# Patient Record
Sex: Male | Born: 1995 | Race: White | Hispanic: No | Marital: Single | Smoking: Current every day smoker
Health system: Southern US, Community
[De-identification: ages and names within clinical notes are randomized; demographics above are authoritative.]

## PROBLEM LIST (undated history)

## (undated) HISTORY — PX: EYE SURGERY: SHX253

---
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
## 2016-05-17 ENCOUNTER — Observation Stay
Admission: EM | Admit: 2016-05-17 | Discharge: 2016-05-18 | Disposition: A | Discharge status: Home / Self Care | Payer: Self-pay | Attending: Urology | Admitting: Urology

## 2016-05-17 ENCOUNTER — Encounter
Admission: EM | Disposition: A | Discharge status: Home / Self Care | Payer: Self-pay | Source: Home / Self Care | Attending: Emergency Medicine

## 2016-05-17 ENCOUNTER — Emergency Department: Payer: Self-pay

## 2016-05-17 ENCOUNTER — Encounter: Payer: Self-pay | Admitting: Emergency Medicine

## 2016-05-17 DIAGNOSIS — F17200 Nicotine dependence, unspecified, uncomplicated: Secondary | ICD-10-CM | POA: Insufficient documentation

## 2016-05-17 DIAGNOSIS — Y998 Other external cause status: Secondary | ICD-10-CM | POA: Insufficient documentation

## 2016-05-17 DIAGNOSIS — W010XXA Fall on same level from slipping, tripping and stumbling without subsequent striking against object, initial encounter: Secondary | ICD-10-CM | POA: Insufficient documentation

## 2016-05-17 DIAGNOSIS — S3022XA Contusion of scrotum and testes, initial encounter: Secondary | ICD-10-CM

## 2016-05-17 DIAGNOSIS — Y92828 Other wilderness area as the place of occurrence of the external cause: Secondary | ICD-10-CM | POA: Insufficient documentation

## 2016-05-17 DIAGNOSIS — T1490XA Injury, unspecified, initial encounter: Secondary | ICD-10-CM

## 2016-05-17 HISTORY — PX: CYSTOSCOPY: SHX5120

## 2016-05-17 HISTORY — PX: HEMATOMA EVACUATION: SHX5118

## 2016-05-17 HISTORY — PX: SCROTAL EXPLORATION: SHX2386

## 2016-05-17 LAB — CBC
HCT: 44.6 % (ref 40.0–52.0)
Hemoglobin: 15.4 g/dL (ref 13.0–18.0)
MCH: 31.1 pg (ref 26.0–34.0)
MCHC: 34.6 g/dL (ref 32.0–36.0)
MCV: 89.8 fL (ref 80.0–100.0)
Platelets: 330 10*3/uL (ref 150–440)
RBC: 4.97 MIL/uL (ref 4.40–5.90)
RDW: 13.1 % (ref 11.5–14.5)
WBC: 17.1 10*3/uL — ABNORMAL HIGH (ref 3.8–10.6)

## 2016-05-17 LAB — BASIC METABOLIC PANEL
Anion gap: 11 (ref 5–15)
BUN: 13 mg/dL (ref 6–20)
CHLORIDE: 106 mmol/L (ref 101–111)
CO2: 22 mmol/L (ref 22–32)
Calcium: 9.5 mg/dL (ref 8.9–10.3)
Creatinine, Ser: 0.95 mg/dL (ref 0.61–1.24)
GFR calc Af Amer: >60 mL/min (ref >60)
GFR calc non Af Amer: >60 mL/min (ref >60)
Glucose, Bld: 125 mg/dL — ABNORMAL HIGH (ref 65–99)
POTASSIUM: 3.2 mmol/L — AB (ref 3.5–5.1)
Sodium: 139 mmol/L (ref 135–145)

## 2016-05-17 LAB — APTT: aPTT: 26 seconds (ref 24–36)

## 2016-05-17 LAB — PROTIME-INR
INR: 1.13
Prothrombin Time: 14.6 seconds (ref 11.4–15.2)

## 2016-05-17 SURGERY — EXPLORATION, SCROTUM
Anesthesia: General | Site: Scrotum | Wound class: Clean Contaminated

## 2016-05-17 MED ORDER — CEFAZOLIN SODIUM-DEXTROSE 2-4 GM/100ML-% IV SOLN
2.0000 g | Freq: Once | INTRAVENOUS | Status: DC
  Filled 2016-05-17: qty 100

## 2016-05-17 MED ORDER — SODIUM CHLORIDE 0.9 % IV BOLUS (SEPSIS)
1000.0000 mL | Freq: Once | INTRAVENOUS | Status: AC
  Administered 2016-05-17: 1000 mL via INTRAVENOUS

## 2016-05-17 MED ORDER — LORAZEPAM 2 MG/ML IJ SOLN
1.0000 mg | Freq: Once | INTRAMUSCULAR | Status: AC
  Administered 2016-05-17: 1 mg via INTRAVENOUS

## 2016-05-17 MED ORDER — HYDROMORPHONE HCL 1 MG/ML IJ SOLN
0.5000 mg | Freq: Once | INTRAMUSCULAR | Status: AC
  Administered 2016-05-17: 0.5 mg via INTRAVENOUS
  Filled 2016-05-17: qty 1

## 2016-05-17 MED ORDER — PROPOFOL 10 MG/ML IV BOLUS
INTRAVENOUS | Status: AC
  Filled 2016-05-17: qty 20

## 2016-05-17 MED ORDER — DEXTROSE 5 % IV SOLN
2.0000 g | Freq: Once | INTRAVENOUS | Status: AC
  Administered 2016-05-18: 2 g via INTRAVENOUS
  Filled 2016-05-17: qty 2000

## 2016-05-17 MED ORDER — LORAZEPAM 2 MG/ML IJ SOLN
INTRAMUSCULAR | Status: AC
  Administered 2016-05-17: 1 mg via INTRAVENOUS
  Filled 2016-05-17: qty 1

## 2016-05-17 MED ORDER — FENTANYL CITRATE (PF) 250 MCG/5ML IJ SOLN
INTRAMUSCULAR | Status: AC
  Filled 2016-05-17: qty 5

## 2016-05-17 SURGICAL SUPPLY — 55 items
BLADE SURG 15 STRL LF DISP TIS (BLADE) ×2 IMPLANT
BLADE SURG 15 STRL SS (BLADE) ×2
CANISTER SUCT 1200ML W/VALVE (MISCELLANEOUS) ×4 IMPLANT
CHLORAPREP W/TINT 26ML (MISCELLANEOUS) ×4 IMPLANT
CLOSURE WOUND 1/2 X4 (GAUZE/BANDAGES/DRESSINGS)
DERMABOND ADVANCED (GAUZE/BANDAGES/DRESSINGS) ×2
DERMABOND ADVANCED .7 DNX12 (GAUZE/BANDAGES/DRESSINGS) ×2 IMPLANT
DRAIN PENROSE 1/4X12 LTX (DRAIN) ×4 IMPLANT
DRAPE LAPAROTOMY 100X77 ABD (DRAPES) ×4 IMPLANT
DRAPE LAPAROTOMY 77X122 PED (DRAPES) ×4 IMPLANT
DRSG TEGADERM 4X4.75 (GAUZE/BANDAGES/DRESSINGS) IMPLANT
DRSG TELFA 4X3 1S NADH ST (GAUZE/BANDAGES/DRESSINGS) IMPLANT
ELECT REM PT RETURN 9FT ADLT (ELECTROSURGICAL) ×4
ELECTRODE REM PT RTRN 9FT ADLT (ELECTROSURGICAL) ×2 IMPLANT
GAUZE FLUFF 18X24 1PLY STRL (GAUZE/BANDAGES/DRESSINGS) ×4 IMPLANT
GAUZE SPONGE 4X4 12PLY STRL (GAUZE/BANDAGES/DRESSINGS) IMPLANT
GAUZE STRETCH 2X75IN STRL (MISCELLANEOUS) IMPLANT
GLOVE BIO SURGEON STRL SZ 6.5 (GLOVE) ×3 IMPLANT
GLOVE BIO SURGEON STRL SZ7 (GLOVE) ×4 IMPLANT
GLOVE BIO SURGEONS STRL SZ 6.5 (GLOVE) ×1
GLOVE INDICATOR 7.0 STRL GRN (GLOVE) ×4 IMPLANT
GOWN STRL REUS W/ TWL LRG LVL3 (GOWN DISPOSABLE) ×4 IMPLANT
GOWN STRL REUS W/TWL LRG LVL3 (GOWN DISPOSABLE) ×4
KIT RM TURNOVER STRD PROC AR (KITS) ×4 IMPLANT
LABEL OR SOLS (LABEL) ×4 IMPLANT
NEEDLE HYPO 25X1 1.5 SAFETY (NEEDLE) ×4 IMPLANT
NS IRRIG 500ML POUR BTL (IV SOLUTION) ×12 IMPLANT
PACK BASIN MINOR ARMC (MISCELLANEOUS) ×4 IMPLANT
PAD ABD DERMACEA PRESS 5X9 (GAUZE/BANDAGES/DRESSINGS) ×4 IMPLANT
PREP PVP WINGED SPONGE (MISCELLANEOUS) IMPLANT
SET INTRO CAPELLA COAXIAL (SET/KITS/TRAYS/PACK) IMPLANT
SOL PREP PVP 2OZ (MISCELLANEOUS)
SOLUTION PREP PVP 2OZ (MISCELLANEOUS) IMPLANT
SPONGE KITTNER 5P (MISCELLANEOUS) IMPLANT
SPONGE LAP 18X18 5 PK (GAUZE/BANDAGES/DRESSINGS) ×8 IMPLANT
STRIP CLOSURE SKIN 1/2X4 (GAUZE/BANDAGES/DRESSINGS) IMPLANT
SUPPORETR ATHLETIC LG (MISCELLANEOUS) IMPLANT
SUPPORT SCROTAL LG STRP (MISCELLANEOUS) ×3 IMPLANT
SUPPORT SCROTAL LRG (SOFTGOODS)
SUPPORT SCROTAL LRG NO STRP (SOFTGOODS) IMPLANT
SUPPORTER ATHLETIC LG (MISCELLANEOUS) ×1
SUT CHROMIC 3 0 SH 27 (SUTURE) IMPLANT
SUT CHROMIC 4 0 SH 27 (SUTURE) IMPLANT
SUT CHROMIC BR 1/2CLE 2-0 54IN (SUTURE) ×4 IMPLANT
SUT ETHILON 3-0 FS-10 30 BLK (SUTURE) ×4
SUT PLAIN 3 0 SH 27IN (SUTURE) IMPLANT
SUT SILK 2 0 SH (SUTURE) IMPLANT
SUT SURGILON NYL 2 0 T19 M (SUTURE) IMPLANT
SUT VIC AB 3-0 SH 27 (SUTURE) ×2
SUT VIC AB 3-0 SH 27X BRD (SUTURE) ×2 IMPLANT
SUT VIC AB 4-0 PS2 18 (SUTURE) IMPLANT
SUT VIC AB 4-0 SH 27 (SUTURE)
SUT VIC AB 4-0 SH 27XANBCTRL (SUTURE) IMPLANT
SUTURE EHLN 3-0 FS-10 30 BLK (SUTURE) ×2 IMPLANT
SYRINGE 10CC LL (SYRINGE) ×4 IMPLANT

## 2016-05-17 NOTE — H&P (Signed)
Urology   I have been asked to see the patient by Dr. Sharma CovertNorman, for evaluation and management of scrotal trauma.  Chief Complaint: scrotal swelling  History of Present Illness: Rodney Leblanc is a 21 y.o. year old who fell earlier today resulting in scrotal trauma. He reports that he slipped on a rock around 3 pm and landed on his scrotum. Initially, there is no significant swelling and the pain subsided. After walking around, his swelling progressed rapidly and became more intense and painful. He reports that the size of his scrotum continues to enlarge even here in the emergency room.  He has voided clear yellow urine several times since trauma.  Scrotal ultrasound shows intact testicles bilaterally with a large fluid collection, heterogeneous measuring 14 x 8 x 8.2 x 11.4 consistent with hematoma.  History reviewed. No pertinent past medical history.  History reviewed. No pertinent surgical history.  Home Medications:  No outpatient prescriptions have been marked as taking for the 05/17/16 encounter [facility](Hospital Encounter).    Allergies: No Known Allergies  History reviewed. No pertinent family history.  Social History:  reports that he has been smoking.  He has never used smokeless tobacco. He reports that he drinks alcohol. He reports that he does not use drugs.  ROS: A complete review of systems was performed.  All systems are negative except for pertinent findings as noted.  Physical Exam:  Vital signs in last 24 hours: Temp:  [98 F (36.7 C)] 98 F (36.7 C) (05/09 2300) Pulse Rate:  [82-114] 82 (05/09 2300) Resp:  [22] 22 (05/09 2300) BP: (137-153)/(82-96) 137/82 (05/09 2300) SpO2:  [91 %-100 %] 98 % (05/09 2300) Weight:  [180 lb (81.6 kg)] 180 lb (81.6 kg) (05/09 2122) Constitutional:  Alert and oriented, No acute distress. Girlfriend at bedside. HEENT: [place] AT, moist mucus membranes.  Trachea midline, no masses Cardiovascular: Regular rate and rhythm, no clubbing,  cyanosis, or edema. Respiratory: Normal respiratory effort, lungs clear bilaterally GI: Abdomen is soft, nontender, nondistended, no abdominal masses GU: Large grapefruit size enlargement of the scrotum, tense, painful, testicles unable to be palpated. She hematoma in the perineum limited by Colles' fascia. No extension into suprapubic area. Normal circumcised phallus, no blood at the meatus, patent. Skin: No rashes, bruises or suspicious lesions Lymph: No cervical or inguinal adenopathy Neurologic: Grossly intact, no focal deficits, moving all 4 extremities Psychiatric: Anxious.   Laboratory Data:   Recent Labs  05/17/16 2130  WBC 17.1*  HGB 15.4  HCT 44.6    Recent Labs  05/17/16 2130  NA 139  K 3.2*  CL 106  CO2 22  GLUCOSE 125*  BUN 13  CREATININE 0.95  CALCIUM 9.5    Recent Labs  05/17/16 2130  INR 1.13   No results for input(s): LABURIN in the last 72 hours. No results found for this or any previous visit.   Radiologic Imaging: [place] Scrotum  Result Date: 05/17/2016 CLINICAL DATA:  Scrotal trauma EXAM: SCROTAL ULTRASOUND DOPPLER ULTRASOUND OF THE TESTICLES TECHNIQUE: Complete ultrasound examination of the testicles, epididymis, and other scrotal structures was performed. Color and spectral Doppler ultrasound were also utilized to evaluate blood flow to the testicles. COMPARISON:  None. FINDINGS: Right testicle Measurements: 3.7 x 2.0 x 2.7 cm. No mass or microlithiasis visualized. Left testicle Measurements: 3.8 x 1.8 x 3.1 cm. No mass or microlithiasis visualized within the testicle itself. Inferior to the left testicle, there is a large, heterogeneous collection measuring 14.8 x 8.2  x 11.4 cm. Right epididymis:  Normal in size and appearance. Left epididymis:  Left epididymal head cyst measuring 6 x 6 x 6 mm. Hydrocele:  None visualized. Varicocele:  None visualized. Pulsed Doppler interrogation of both testes demonstrates normal low resistance arterial and venous  waveforms bilaterally. IMPRESSION: 1. Heterogeneous collection inferior to the left testicle, measuring 14.8 x 8.2 x 11.4 cm. In the context of acute trauma, this is most consistent with hematoma. Areas of color Doppler signal could indicate ongoing hemorrhage, versus motion of blood products. 2. No visualized testicular fracture or rupture. 3. Normal bilateral testicular blood flow. Electronically Signed   By: Deatra Robinson M.D.   On: 05/17/2016 22:40    Assessment/plan: 21 year old male with blunt scrotal trauma resulting in a large scrotal hematoma. I recommended proceeding to the operating room given the size and expansile nature of the hematoma for scrotal expiration, evacuation of hematoma. Additionally, I recommended cystoscopy to rule out any urethral injury although unlikely.  We discussed the procedure in detail. All questions were answered. Risks including bleeding, infection, damage surrounding structures, testicular loss, need for further procedures, failure to resolve his scrotal swelling, need for surgery were all discussed in detail. All questions were answered.  He is currently nothing by mouth. Will admit him overnight for observation following the procedure.   05/17/2016, 11:38 PM  Vanna Scotland,  MD

## 2016-05-17 NOTE — ED Notes (Signed)
Pt sts that he had pain and swelling after injury at 1600, but subsided w/ ice. Pt sts while walking around in mall swelling and pain incr. Pts scrotum obviously swollen, estimated size of grapefruit. Area red and dark spot noted to posterior section pts scrotum. Pt denies pain at this time. Pt sts he had episode of emesis on way to ED.  Pt not nauseous at this time.

## 2016-05-17 NOTE — ED Notes (Signed)
Patient transported to Ultrasound 

## 2016-05-17 NOTE — ED Provider Notes (Signed)
[facility] Emergency Department Provider Note  ____________________________________________  Time seen: Approximately 9:48 PM  I have reviewed the triage vital signs and the nursing notes.   HISTORY  Chief Complaint Groin Swelling    HPI Rodney Leblanc is a 21 y.o. male , otherwise healthy, presenting with swollen, chemotic and painful scrotum after a fall. The patient reports that around 4 PM, he slipped on a rock in a river, and his scrotum landed on a pointty rock. He had some mild to moderate pain but continuedhis walker. When he got home, he applied ice to the scrotum which was mildly inflamed, and then went out to [employer]. While he was there, he developed progressive worsening pain and swelling, and took 1200 mg of Motrin which improved his pain. In route to the emergency department, the patient began hyperventilating, became diaphoretic with bilateral hand numbness and one episode of vomiting. On arrival to the emergency department, his scrotum was noted to be markedly enlarged and a scrotal ultrasound was ordered immediately.  History reviewed. No pertinent past medical history.  There are no active problems to display for this patient.   History reviewed. No pertinent surgical history.    Allergies Patient has no known allergies.  History reviewed. No pertinent family history.  Social History Social History  Substance Use Topics  . Smoking status: Current Every Day Smoker  . Smokeless tobacco: Never Used  . Alcohol use Yes    Review of Systems Constitutional: No fever/chills.No lightheadedness or syncope. Did not strike head. No loss of consciousness. Eyes: No visual changes. ENT:  No congestion or rhinorrhea. Cardiovascular: Denies chest pain. Denies palpitations. Respiratory: Denies shortness of breath.  No cough. Gastrointestinal: No abdominal pain.  No nausea, no vomiting.  No diarrhea.  No constipation. Genitourinary:  Negative for dysuria. Positive scrotal swelling, ecchymosis and pain. Musculoskeletal: Negative for back pain. Skin: Negative for rash. Neurological: Negative for headaches. No focal numbness, tingling or weakness.   10-point ROS otherwise negative.  ____________________________________________   PHYSICAL EXAM:  VITAL SIGNS: ED Triage Vitals  Enc Vitals Group     BP 05/17/16 2126 (!) 153/96     Pulse Rate 05/17/16 2126 (!) 114     Resp 05/17/16 2126 (!) 22     Temp 05/17/16 2126 98 F (36.7 C)     Temp Source 05/17/16 2126 Oral     SpO2 05/17/16 2126 91 %     Weight 05/17/16 2122 180 lb (81.6 kg)     Height 05/17/16 2122 5\' 6"  (1.676 m)     Head Circumference --      Peak Flow --      Pain Score 05/17/16 2140 3     Pain Loc --      Pain Edu? --      Excl. in GC? --     Constitutional: Alert and oriented. Uncomfortable appearing and anxious but nontoxic. Answers questions appropriately. Eyes: Conjunctivae are normal.  EOMI. No scleral icterus. Head: Atraumatic. Nose: No congestion/rhinnorhea. Mouth/Throat: Mucous membranes are moist.  Neck: No stridor.  Supple.   Cardiovascular: Fast rate Respiratory: Mild tachypnea.  No accessory muscle use or retractions.  Genitourinary: Markedly enlarged testicle the size of a large grapefruit that is diffusely ecchymotic and tender to palpation with additional ecchymosis in the perineum. There is no skin break. The penis is uncircumcised without any lesions or discharge Musculoskeletal: No LE edema. Neurologic:  A&Ox3.  Speech is clear.  Face and smile are  symmetric.  EOMI.  Moves all extremities well. Skin:  Skin is warm, dry and intact. No rash noted. Psychiatric: Anxious affect.  ____________________________________________   LABS (all labs ordered are listed, but only abnormal results are displayed)  Labs Reviewed - No data to display ____________________________________________  EKG  Not  indicated ____________________________________________  RADIOLOGY  US Scrotum  Result Date: 05/17/2016 CLINICAL DATA:  Scrotal trauma EXAM: SCROTAL ULTRASOUND DOPPLER ULTRASOUND OF THE TESTICLES TECHNIQUE: Complete ultrasound examination of the testicles, epididymis, and other scrotal structures was performed. Color and spectral Doppler ultrasound were also utilized to evaluate blood flow to the testicles. COMPARISON:  None. FINDINGS: Right testicle Measurements: 3.7 x 2.0 x 2.7 cm. No mass or microlithiasis visualized. Left testicle Measurements: 3.8 x 1.8 x 3.1 cm. No mass or microlithiasis visualized within the testicle itself. Inferior to the left testicle, there is a large, heterogeneous collection measuring 14.8 x 8.2 x 11.4 cm. Right epididymis:  Normal in size and appearance. Left epididymis:  Left epididymal head cyst measuring 6 x 6 x 6 mm. Hydrocele:  None visualized. Varicocele:  None visualized. Pulsed Doppler interrogation of both testes demonstrates normal low resistance arterial and venous waveforms bilaterally. IMPRESSION: 1. Heterogeneous collection inferior to the left testicle, measuring 14.8 x 8.2 x 11.4 cm. In the context of acute trauma, this is most consistent with hematoma. Areas of color Doppler signal could indicate ongoing hemorrhage, versus motion of blood products. 2. No visualized testicular fracture or rupture. 3. Normal bilateral testicular blood flow. Electronically Signed   By: Deatra Robinson M.D.   On: 05/17/2016 22:40    ____________________________________________   PROCEDURES  Procedure(s) performed: None  Procedures  Critical Care performed: No ____________________________________________   INITIAL IMPRESSION / ASSESSMENT AND PLAN / ED COURSE  Pertinent labs & imaging results that were available during my care of the patient were reviewed by me and considered in my medical decision making (see chart for details).  22 y.o. male, otherwise healthy,  presenting with significant swelling, ecchymosis and pain after scrotal injury. Overall, I am concerned about acute testicular trauma; to secure torsion is less likely. Plan ultrasound, Ativan for his significant anxiety. Pain medication is held at this time as the patient states that his pain has been well controlled with Motrin. Ice pack to the groin. Plan reevaluation for final disposition.  ----------------------------------------- 11:00 PM on 05/17/2016 -----------------------------------------  The patient's ultrasound does not show any testicular rupture or fracture, but he does have a large hematoma with a question of ongoing bleeding. I've spoken with Dr. Vanna Scotland, the urologist on-call, who will bring the patient to the operating room for exploration and evaluation. The patient understands the plan.  ____________________________________________  FINAL CLINICAL IMPRESSION(S) / ED DIAGNOSES  Final diagnoses:  Traumatic scrotal hematoma, initial encounter         NEW MEDICATIONS STARTED DURING THIS VISIT:  New Prescriptions   No medications on file      Rockne Menghini, MD 05/17/16 2312

## 2016-05-17 NOTE — ED Triage Notes (Signed)
Pt brought straight to RM 15 for triage. Pt slipped on rock in river landing on rock at 1600 today, pt has had testicular swelling and pain since. Pt hyperventilating upon arrival, pale in color, pt placed on O2, MD at bedside.

## 2016-05-17 NOTE — ED Notes (Signed)
Report given to [employer] RN

## 2016-05-17 NOTE — ED Notes (Signed)
ED Provider at bedside. 

## 2016-05-18 ENCOUNTER — Encounter: Payer: Self-pay | Admitting: Anesthesiology

## 2016-05-18 ENCOUNTER — Observation Stay: Payer: Self-pay | Admitting: Anesthesiology

## 2016-05-18 ENCOUNTER — Telehealth: Payer: Self-pay | Admitting: Emergency Medicine

## 2016-05-18 ENCOUNTER — Encounter: Payer: Self-pay | Admitting: *Deleted

## 2016-05-18 ENCOUNTER — Emergency Department
Admission: EM | Admit: 2016-05-18 | Discharge: 2016-05-18 | Disposition: A | Discharge status: Home / Self Care | Payer: Self-pay | Attending: Emergency Medicine | Admitting: Emergency Medicine

## 2016-05-18 DIAGNOSIS — R339 Retention of urine, unspecified: Secondary | ICD-10-CM | POA: Insufficient documentation

## 2016-05-18 DIAGNOSIS — F17200 Nicotine dependence, unspecified, uncomplicated: Secondary | ICD-10-CM | POA: Insufficient documentation

## 2016-05-18 DIAGNOSIS — T1490XA Injury, unspecified, initial encounter: Secondary | ICD-10-CM

## 2016-05-18 DIAGNOSIS — S3022XA Contusion of scrotum and testes, initial encounter: Secondary | ICD-10-CM | POA: Diagnosis present

## 2016-05-18 LAB — URINALYSIS, COMPLETE (UACMP) WITH MICROSCOPIC
BILIRUBIN URINE: NEGATIVE
Bacteria, UA: NONE SEEN
GLUCOSE, UA: NEGATIVE mg/dL
HGB URINE DIPSTICK: NEGATIVE
Ketones, ur: 5 mg/dL — AB
LEUKOCYTES UA: NEGATIVE
NITRITE: NEGATIVE
Protein, ur: NEGATIVE mg/dL
RBC / HPF: NONE SEEN RBC/hpf (ref 0–5)
Specific Gravity, Urine: 1.01 (ref 1.005–1.030)
Squamous Epithelial / LPF: NONE SEEN
pH: 6 (ref 5.0–8.0)

## 2016-05-18 LAB — TYPE AND SCREEN
ABO/RH(D): O POS
ANTIBODY SCREEN: NEGATIVE

## 2016-05-18 MED ORDER — LIDOCAINE HCL 1 % IJ SOLN
INTRAMUSCULAR | Status: DC | PRN
  Administered 2016-05-18: 5 mL

## 2016-05-18 MED ORDER — LIDOCAINE HCL 2 % EX GEL
1.0000 "application " | Freq: Once | CUTANEOUS | Status: DC
  Filled 2016-05-18 (×2): qty 5

## 2016-05-18 MED ORDER — SODIUM CHLORIDE 0.9 % IV SOLN
INTRAVENOUS | Status: DC
  Administered 2016-05-18: 03:00:00 via INTRAVENOUS

## 2016-05-18 MED ORDER — OXYCODONE-ACETAMINOPHEN 5-325 MG PO TABS
1.0000 | ORAL_TABLET | ORAL | Status: DC | PRN
  Administered 2016-05-18 (×2): 2 via ORAL
  Filled 2016-05-18 (×2): qty 2

## 2016-05-18 MED ORDER — ONDANSETRON HCL 4 MG/2ML IJ SOLN: 4.0000 mg | INTRAMUSCULAR | Status: DC | PRN

## 2016-05-18 MED ORDER — OXYCODONE HCL 5 MG/5ML PO SOLN: 5.0000 mg | Freq: Once | ORAL | Status: DC | PRN

## 2016-05-18 MED ORDER — ROCURONIUM BROMIDE 100 MG/10ML IV SOLN
INTRAVENOUS | Status: AC
  Filled 2016-05-18: qty 1

## 2016-05-18 MED ORDER — DOCUSATE SODIUM 100 MG PO CAPS: 100.0000 mg | ORAL_CAPSULE | Freq: Two times a day (BID) | ORAL | 0 refills | Status: DC

## 2016-05-18 MED ORDER — ACETAMINOPHEN 325 MG PO TABS: 650.0000 mg | ORAL_TABLET | ORAL | Status: DC | PRN

## 2016-05-18 MED ORDER — PROPOFOL 10 MG/ML IV BOLUS
INTRAVENOUS | Status: AC
  Filled 2016-05-18: qty 20

## 2016-05-18 MED ORDER — LIDOCAINE HCL (CARDIAC) 20 MG/ML IV SOLN
INTRAVENOUS | Status: DC | PRN
  Administered 2016-05-18: 60 mg via INTRAVENOUS

## 2016-05-18 MED ORDER — HYDROCODONE-ACETAMINOPHEN 5-325 MG PO TABS
1.0000 | ORAL_TABLET | Freq: Once | ORAL | Status: AC
  Administered 2016-05-18: 1 via ORAL
  Filled 2016-05-18: qty 1

## 2016-05-18 MED ORDER — OXYCODONE HCL 5 MG PO TABS: 5.0000 mg | ORAL_TABLET | Freq: Once | ORAL | Status: DC | PRN

## 2016-05-18 MED ORDER — FENTANYL CITRATE (PF) 100 MCG/2ML IJ SOLN: 25.0000 ug | INTRAMUSCULAR | Status: DC | PRN

## 2016-05-18 MED ORDER — OXYCODONE-ACETAMINOPHEN 5-325 MG PO TABS: 1.0000 | ORAL_TABLET | ORAL | 0 refills | Status: DC | PRN

## 2016-05-18 MED ORDER — SUCCINYLCHOLINE CHLORIDE 20 MG/ML IJ SOLN
INTRAMUSCULAR | Status: AC
  Filled 2016-05-18: qty 1

## 2016-05-18 MED ORDER — SUCCINYLCHOLINE CHLORIDE 20 MG/ML IJ SOLN
INTRAMUSCULAR | Status: DC | PRN
  Administered 2016-05-18: 140 mg via INTRAVENOUS

## 2016-05-18 MED ORDER — MORPHINE SULFATE (PF) 2 MG/ML IV SOLN: 2.0000 mg | INTRAVENOUS | Status: DC | PRN

## 2016-05-18 MED ORDER — SUGAMMADEX SODIUM 200 MG/2ML IV SOLN
INTRAVENOUS | Status: DC | PRN
  Administered 2016-05-18: 163.2 mg via INTRAVENOUS

## 2016-05-18 MED ORDER — DOCUSATE SODIUM 100 MG PO CAPS
100.0000 mg | ORAL_CAPSULE | Freq: Two times a day (BID) | ORAL | Status: DC
  Administered 2016-05-18: 100 mg via ORAL
  Filled 2016-05-18: qty 1

## 2016-05-18 MED ORDER — LIDOCAINE HCL 2 % EX GEL
1.0000 "application " | Freq: Once | CUTANEOUS | Status: AC
  Administered 2016-05-18: 1 via URETHRAL

## 2016-05-18 MED ORDER — HYDROMORPHONE HCL 1 MG/ML IJ SOLN: 0.2500 mg | INTRAMUSCULAR | Status: DC | PRN

## 2016-05-18 MED ORDER — ROCURONIUM BROMIDE 100 MG/10ML IV SOLN
INTRAVENOUS | Status: DC | PRN
  Administered 2016-05-18: 20 mg via INTRAVENOUS

## 2016-05-18 MED ORDER — LIDOCAINE HCL 2 % EX GEL
1.0000 "application " | Freq: Once | CUTANEOUS | Status: DC
  Filled 2016-05-18: qty 5

## 2016-05-18 MED ORDER — DEXAMETHASONE SODIUM PHOSPHATE 10 MG/ML IJ SOLN
INTRAMUSCULAR | Status: DC | PRN
  Administered 2016-05-18: 4 mg via INTRAVENOUS

## 2016-05-18 MED ORDER — LACTATED RINGERS IV SOLN
INTRAVENOUS | Status: DC | PRN
  Administered 2016-05-18: via INTRAVENOUS

## 2016-05-18 MED ORDER — CEFAZOLIN SODIUM 1 G IJ SOLR
INTRAMUSCULAR | Status: AC
  Filled 2016-05-18: qty 20

## 2016-05-18 MED ORDER — LIDOCAINE HCL (PF) 2 % IJ SOLN
INTRAMUSCULAR | Status: AC
  Filled 2016-05-18: qty 2

## 2016-05-18 MED ORDER — MORPHINE SULFATE (PF) 4 MG/ML IV SOLN
4.0000 mg | Freq: Once | INTRAVENOUS | Status: AC
  Administered 2016-05-18: 4 mg via INTRAVENOUS

## 2016-05-18 MED ORDER — PROPOFOL 10 MG/ML IV BOLUS
INTRAVENOUS | Status: DC | PRN
  Administered 2016-05-18 (×2): 40 mg via INTRAVENOUS
  Administered 2016-05-18: 160 mg via INTRAVENOUS

## 2016-05-18 MED ORDER — ESMOLOL HCL 100 MG/10ML IV SOLN
INTRAVENOUS | Status: AC
  Filled 2016-05-18: qty 10

## 2016-05-18 MED ORDER — DIPHENHYDRAMINE HCL 12.5 MG/5ML PO ELIX: 12.5000 mg | ORAL_SOLUTION | Freq: Four times a day (QID) | ORAL | Status: DC | PRN

## 2016-05-18 MED ORDER — BUPIVACAINE HCL 0.5 % IJ SOLN
INTRAMUSCULAR | Status: DC | PRN
  Administered 2016-05-18: 5 mL

## 2016-05-18 MED ORDER — DIPHENHYDRAMINE HCL 50 MG/ML IJ SOLN
12.5000 mg | Freq: Four times a day (QID) | INTRAMUSCULAR | Status: DC | PRN
Start: 2016-05-18 — End: 2016-05-18

## 2016-05-18 MED ORDER — FENTANYL CITRATE (PF) 100 MCG/2ML IJ SOLN
INTRAMUSCULAR | Status: DC | PRN
  Administered 2016-05-18: 100 ug via INTRAVENOUS
  Administered 2016-05-18: 50 ug via INTRAVENOUS
  Administered 2016-05-18: 100 ug via INTRAVENOUS

## 2016-05-18 MED ORDER — MORPHINE SULFATE (PF) 4 MG/ML IV SOLN
INTRAVENOUS | Status: AC
  Administered 2016-05-18: 4 mg via INTRAVENOUS
  Filled 2016-05-18: qty 1

## 2016-05-18 NOTE — ED Provider Notes (Signed)
[facility] Emergency Department Provider Note  ____________________________________________  Time seen: Approximately 7:46 PM  I have reviewed the triage vital signs and the nursing notes.   HISTORY  Chief Complaint Urinary Retention   HPI Rodney Leblanc is a 21 y.o. male POD 0 from evacuation of traumatic scrotal hematoma who presents for evaluation of urinary retention. Has not been able to void since being discharged from the hospital. Patient is complaining of severe sharp pain located in the suprapubic region that has been getting progressively worse throughout the day. No nausea or vomiting, no fever or chills.  No past medical history on file.  Patient Active Problem List   Diagnosis Date Noted  . Traumatic scrotal hematoma 05/18/2016  . Traumatic scrotal hematoma, initial encounter     Past Surgical History:  Procedure Laterality Date  . CYSTOSCOPY  05/17/2016   Procedure: CYSTOSCOPY FLEXIBLE;  Surgeon: Vanna ScotlandBrandon, Ashley, MD;  Location: [facility] ORS;  Service: Urology;;  . HEMATOMA EVACUATION N/A 05/17/2016   Procedure: EVACUATION HEMATOMA- 350ML;  Surgeon: Vanna ScotlandBrandon, Ashley, MD;  Location: [facility] ORS;  Service: Urology;  Laterality: N/A;  . SCROTAL EXPLORATION N/A 05/17/2016   Procedure: SCROTUM EXPLORATION;  Surgeon: Vanna ScotlandBrandon, Ashley, MD;  Location: [facility] ORS;  Service: Urology;  Laterality: N/A;    Prior to Admission medications   Medication Sig Start Date End Date Taking? Authorizing Provider  docusate sodium (COLACE) 100 MG capsule Take 1 capsule (100 mg total) by mouth 2 (two) times daily. 05/18/16   Vanna ScotlandBrandon, Ashley, MD  oxyCODONE-acetaminophen (PERCOCET) 5-325 MG tablet Take 1-2 tablets by mouth every 4 (four) hours as needed for moderate pain or severe pain. 05/18/16   Vanna ScotlandBrandon, Ashley, MD    Allergies Patient has no known allergies.  No family history on file.  Social History Social History  Substance Use Topics  . Smoking status: Current  Every Day Smoker  . Smokeless tobacco: Never Used  . Alcohol use Yes    Review of Systems  Constitutional: Negative for fever. Eyes: Negative for visual changes. ENT: Negative for sore throat. Neck: No neck pain  Cardiovascular: Negative for chest pain. Respiratory: Negative for shortness of breath. Gastrointestinal: Negative for abdominal pain, vomiting or diarrhea. Genitourinary: Negative for dysuria. + urinary retention Musculoskeletal: Negative for back pain. Skin: Negative for rash. Neurological: Negative for headaches, weakness or numbness. Psych: No SI or HI  ____________________________________________   PHYSICAL EXAM:  VITAL SIGNS: ED Triage Vitals  Enc Vitals Group     BP 05/18/16 1930 (!) 142/88     Pulse Rate 05/18/16 1930 100     Resp 05/18/16 1930 20     Temp 05/18/16 1930 97.8 F (36.6 C)     Temp Source 05/18/16 1930 Oral     SpO2 05/18/16 1930 100 %     Weight 05/18/16 1930 185 lb (83.9 kg)     Height 05/18/16 1930 5\' 6"  (1.676 m)     Head Circumference --      Peak Flow --      Pain Score 05/18/16 1928 5     Pain Loc --      Pain Edu? --      Excl. in GC? --     Constitutional: Alert and oriented. Well appearing and in no apparent distress. HEENT:      Head: Normocephalic and atraumatic.         Eyes: Conjunctivae are normal. Sclera is non-icteric. EOMI. PERRL      Mouth/Throat: Mucous  membranes are moist.       Neck: Supple with no signs of meningismus. Cardiovascular: Regular rate and rhythm. No murmurs, gallops, or rubs. 2+ symmetrical distal pulses are present in all extremities. No JVD. Respiratory: Normal respiratory effort. Lungs are clear to auscultation bilaterally. No wheezes, crackles, or rhonchi.  Gastrointestinal: Soft, tenderness to palpation over the suprapubic region with bladder distention, with positive bowel sounds. No rebound or guarding. Genitourinary: No CVA tenderness. Swollen and erythematous scrotum with penrose drain in  place Musculoskeletal: Nontender with normal range of motion in all extremities. No edema, cyanosis, or erythema of extremities. Neurologic: Normal speech and language. Face is symmetric. Moving all extremities. No gross focal neurologic deficits are appreciated. Skin: Skin is warm, dry and intact. No rash noted. Psychiatric: Mood and affect are normal. Speech and behavior are normal.  ____________________________________________   LABS (all labs ordered are listed, but only abnormal results are displayed)  Labs Reviewed  URINALYSIS, COMPLETE (UACMP) WITH MICROSCOPIC   ____________________________________________  EKG  none ____________________________________________  RADIOLOGY  none  ____________________________________________   PROCEDURES  Procedure(s) performed: None Procedures Critical Care performed:  None ____________________________________________   INITIAL IMPRESSION / ASSESSMENT AND PLAN / ED COURSE  21 y.o. male POD 0 from evacuation of traumatic scrotal hematoma who presents for evaluation of urinary retention. Bladder scan showing 985 cc. Foley catheter placed with urojet application. UA pending. Patient will be dc home with Foley instructions and f/u with his Urologist for TOV in a few days.      Pertinent labs & imaging results that were available during my care of the patient were reviewed by me and considered in my medical decision making (see chart for details).    ____________________________________________   FINAL CLINICAL IMPRESSION(S) / ED DIAGNOSES  Final diagnoses:  Urinary retention      NEW MEDICATIONS STARTED DURING THIS VISIT:  New Prescriptions   No medications on file     Note:  This document was prepared using Dragon voice recognition software and may include unintentional dictation errors.    Nita Sickle, MD 05/18/16 2016

## 2016-05-18 NOTE — Brief Op Note (Signed)
05/17/2016 - 05/18/2016  1:17 AM  PATIENT:  Rodney Leblanc  21 y.o. male  PRE-OPERATIVE DIAGNOSIS:  scrotal trauma  POST-OPERATIVE DIAGNOSIS:  scrotal trauma  PROCEDURE:  Procedure(s): SCROTUM EXPLORATION (N/A) EVACUATION HEMATOMA- 350ML (N/A) CYSTOSCOPY FLEXIBLE  SURGEON:  Surgeon(s) and Role:    Vanna Scotland* Quindon Denker, MD - Primary  PHYSICIAN ASSISTANT:   ASSISTANTS: none   ANESTHESIA:   general  EBL:  No intake/output data recorded.  BLOOD ADMINISTERED:none  DRAINS: Penrose drain in the scrotum   LOCAL MEDICATIONS USED:  MARCAINE    and LIDOCAINE   SPECIMEN:  No Specimen  DISPOSITION OF SPECIMEN:  N/A  COUNTS:  YES  TOURNIQUET:  * No tourniquets in log *  DICTATION: .Note written in EPIC  PLAN OF CARE: Admit for overnight observation  PATIENT DISPOSITION:  PACU - hemodynamically stable.   Delay start of Pharmacological VTE agent (>24hrs) due to surgical blood loss or risk of bleeding: yes

## 2016-05-18 NOTE — Discharge Instructions (Signed)

## 2016-05-18 NOTE — ED Provider Notes (Signed)
D/W Dr. Sherryl BartersBudzyn of urology who agrees with foley   Jene EveryKinner, Korion Cuevas, MD 05/18/16 2043

## 2016-05-18 NOTE — Anesthesia Preprocedure Evaluation (Signed)
Anesthesia Evaluation  Patient identified by MRN, date of birth, ID band Patient awake    Reviewed: Allergy & Precautions, H&P , NPO status , Patient's Chart, lab work & pertinent test results  Airway Mallampati: II  TM Distance: <3 FB Neck ROM: full    Dental  (+) Chipped   Pulmonary neg shortness of breath, Current Smoker,    Pulmonary exam normal breath sounds clear to auscultation       Cardiovascular Exercise Tolerance: Good (-) angina(-) Past MI and (-) DOE Normal cardiovascular exam+ dysrhythmias  Rhythm:regular Rate:Normal     Neuro/Psych negative neurological ROS  negative psych ROS   GI/Hepatic Neg liver ROS, GERD  Controlled,  Endo/Other  negative endocrine ROS  Renal/GU      Musculoskeletal   Abdominal   Peds  Hematology negative hematology ROS (+)   Anesthesia Other Findings  History reviewed. No pertinent surgical history.  BMI    Body Mass Index:  29.05 kg/m      Reproductive/Obstetrics negative OB ROS                             Anesthesia Physical Anesthesia Plan  ASA: III  Anesthesia Plan: General ETT   Post-op Pain Management:    Induction: Intravenous  Airway Management Planned: Oral ETT  Additional Equipment:   Intra-op Plan:   Post-operative Plan: Extubation in OR  Informed Consent: I have reviewed the patients History and Physical, chart, labs and discussed the procedure including the risks, benefits and alternatives for the proposed anesthesia with the patient or authorized representative who has indicated his/her understanding and acceptance.   Dental Advisory Given  Plan Discussed with: Anesthesiologist, CRNA and Surgeon  Anesthesia Plan Comments: (Patient consented for risks of anesthesia including but not limited to:  - adverse reactions to medications - damage to teeth, lips or other oral mucosa - sore throat or hoarseness - Damage to  heart, brain, lungs or loss of life  Patient voiced understanding.)        Anesthesia Quick Evaluation

## 2016-05-18 NOTE — Transfer of Care (Signed)
Immediate Anesthesia Transfer of Care Note  Patient: Rodney Leblanc  Procedure(s) Performed: Procedure(s): SCROTUM EXPLORATION (N/A) EVACUATION HEMATOMA- 350ML (N/A) CYSTOSCOPY FLEXIBLE  Patient Location: PACU  Anesthesia Type:General  Level of Consciousness: awake and alert   Airway & Oxygen Therapy: Patient Spontanous Breathing and Patient connected to face mask oxygen  Post-op Assessment: Report given to RN and Post -op Vital signs reviewed and stable  Post vital signs: Reviewed and stable  Last Vitals:  Vitals:   05/17/16 2221 05/17/16 2300  BP: (!) 142/88 137/82  Pulse: 92 82  Resp:  (!) 22  Temp:  36.7 C    Last Pain:  Vitals:   05/17/16 2347  TempSrc:   PainSc: 2          Complications: No apparent anesthesia complications

## 2016-05-18 NOTE — Progress Notes (Signed)
Discharge order received. Patient is alert and oriented. Vital signs stable . No signs of acute distress but patient was concerned about swelling. Patient "more swollen today than yesterday" I notifed Dr. Apolinar JunesBrandon of patient concern. Per MD, that is normal post surgery.  Discharge instructions given. Patient verbalized understanding. No other issues noted at this time. Prescription given. Transported by Agricultural consultantvolunteer.

## 2016-05-18 NOTE — Anesthesia Procedure Notes (Signed)
Procedure Name: Intubation Date/Time: 05/18/2016 12:18 AM Performed by: Lendon Colonel Pre-anesthesia Checklist: Patient identified, Patient being monitored, Timeout performed, Emergency Drugs available and Suction available Patient Re-evaluated:Patient Re-evaluated prior to inductionOxygen Delivery Method: Circle system utilized Preoxygenation: Pre-oxygenation with 100% oxygen Intubation Type: IV induction and Rapid sequence Laryngoscope Size: Mac and 3 Grade View: Grade I Tube type: Oral Tube size: 7.0 mm Number of attempts: 1 Airway Equipment and Method: Stylet Placement Confirmation: ETT inserted through vocal cords under direct vision,  positive ETCO2 and breath sounds checked- equal and bilateral Secured at: 21 cm Tube secured with: Tape Dental Injury: Teeth and Oropharynx as per pre-operative assessment

## 2016-05-18 NOTE — ED Notes (Addendum)
Patient reports not being able to urinate since this morning. Reports increased swelling of scrotum throughout the day.

## 2016-05-18 NOTE — Anesthesia Postprocedure Evaluation (Signed)
Anesthesia Post Note  Patient: Rodney Leblanc  Procedure(s) Performed: Procedure(s) (LRB): SCROTUM EXPLORATION (N/A) EVACUATION HEMATOMA- 350ML (N/A) CYSTOSCOPY FLEXIBLE  Patient location during evaluation: PACU Anesthesia Type: General Level of consciousness: awake and alert Pain management: pain level controlled Vital Signs Assessment: post-procedure vital signs reviewed and stable Respiratory status: spontaneous breathing, nonlabored ventilation, respiratory function stable and patient connected to nasal cannula oxygen Cardiovascular status: blood pressure returned to baseline and stable Postop Assessment: no signs of nausea or vomiting Anesthetic complications: no     Last Vitals:  Vitals:   05/18/16 0225 05/18/16 0308  BP: 135/82 111/63  Pulse: (!) 104 87  Resp: 18 18  Temp: 36.7 C 36.8 C    Last Pain:  Vitals:   05/18/16 0454  TempSrc:   PainSc: Asleep                 Cleda MccreedyJoseph K Piscitello

## 2016-05-18 NOTE — ED Notes (Signed)
Bladder scan done. of urine seen. MD made aware.

## 2016-05-18 NOTE — Telephone Encounter (Signed)
[facility] calling to obtain a ICD10 # for the narcotic RX, this RN had to call Dr.Brandon whom did surgery and wrote the RX for oxycodone, spoke with Dr.Brandon via phone received ICD 10 #  S30.22XD, scrotal hematoma . Called [facility] back with requested information.

## 2016-05-18 NOTE — Discharge Summary (Signed)
Date of admission: 05/17/2016  Date of discharge: 05/18/2016  Admission diagnosis: Scrotal hematoma  Discharge diagnosis: Scrotal hematoma status post cystoscopy, scrotal exploration and evacuation of scrotal hematoma  Secondary diagnoses:  Patient Active Problem List   Diagnosis Date Noted  . Traumatic scrotal hematoma 05/18/2016  . Traumatic scrotal hematoma, initial encounter     History and Physical: For full details, please see admission history and physical. Briefly, Rodney Leblanc is a 21 y.o. year old patient with blunt trauma to the scrotum resulting in a significant scrotal hematoma who is status post cystoscopy, scrotal aspiration and evacuation of the scrotal hematoma last night.  Hospital Course: Patient tolerated the procedure well.  He was then transferred to the floor after an uneventful PACU stay.  His hospital course was uncomplicated.  On POD# 1  *he had met discharge criteria: was eating a regular diet, was up and ambulating independently,  pain was well controlled, was voiding without a catheter, and was ready to for discharge.   Laboratory values:   Recent Labs  05/17/16 2130  WBC 17.1*  HGB 15.4  HCT 44.6    Recent Labs  05/17/16 2130  NA 139  K 3.2*  CL 106  CO2 22  GLUCOSE 125*  BUN 13  CREATININE 0.95  CALCIUM 9.5    Recent Labs  05/17/16 2130  INR 1.13   No results for input(s): LABURIN in the last 72 hours. No results found for this or any previous visit. Constitutional: Well nourished. Alert and oriented, No acute distress. HEENT: [place] AT, moist mucus membranes. Trachea midline, no masses. Cardiovascular: No clubbing, cyanosis, or edema. Respiratory: Normal respiratory effort, no increased work of breathing. GI: Abdomen is soft, non tender, non distended, no abdominal masses. Liver and spleen not palpable.  No hernias appreciated.  Stool sample for occult testing is not indicated.   GU: No CVA tenderness.  No bladder fullness or  masses.  Patient with circumcised phallus. Urethral meatus is patent.  No penile discharge. No penile lesions or rashes. Scrotum with moderate swelling and mild bruising. Incision is clean and dry. Penrose drain in place. A scant amount of serosanguineous drainage is seen on dressing, otherwise dressing is clean and dry. Rectal: Not performed. Skin: No rashes, bruises or suspicious lesions. Lymph: No cervical or inguinal adenopathy. Neurologic: Grossly intact, no focal deficits, moving all 4 extremities. Psychiatric: Normal mood and affect.  Disposition: Home  Discharge instruction: The patient was instructed to be ambulatory but told to refrain from heavy lifting, strenuous activity, or driving.   Instructed to change dressing every 2-4 hours to keep the incision clean and dry. Reviewed red flag signs such as fevers reaching or exceeding 102, increased scrotal swelling, purulent drainage from the drain or incision site or excessive bleeding from the incision or drain.  Penrose drain to remain in place until Monday morning when patient will report to [facility] urological for its removal.  Discharge medications: Allergies as of 05/18/2016   No Known Allergies     Medication List    TAKE these medications   docusate sodium 100 MG capsule Commonly known as:  COLACE Take 1 capsule (100 mg total) by mouth 2 (two) times daily.   oxyCODONE-acetaminophen 5-325 MG tablet Commonly known as:  PERCOCET Take 1-2 tablets by mouth every 4 (four) hours as needed for moderate pain or severe pain.       Followup:  Follow-up Information    [facility].   Why:  appointment  for Monday to remove scrotal drain

## 2016-05-18 NOTE — Op Note (Signed)
Date of procedure: 05/18/16  Preoperative diagnosis:  1. Scrotal hematoma   Postoperative diagnosis:  1. Scrotal hematoma   Procedure: 1. Cystoscopy 2. Scrotal exploration 3. Evacuation of scrotal hematoma  Surgeon: Vanna ScotlandAshley Marchelle Rinella, MD  Anesthesia: General  Complications: None  Intraoperative findings: Cystoscopy negative for any urethral injury. Diffuse oozing from the posterior aspect of his scrotum, approximately 350 cc of blood/clot evacuated from scrotum.  EBL: 350 cc  Specimens: None  Drains: Penrose drain in dependent portion of the scrotum  Indication: Rodney FridayHunter W Chase Leblanc is a 21 y.o. patient with scrotal injury from a fall with an expanding scrotal hematoma.  After reviewing the management options for treatment, he elected to proceed with the above surgical procedure(s). We have discussed the potential benefits and risks of the procedure, side effects of the proposed treatment, the likelihood of the patient achieving the goals of the procedure, and any potential problems that might occur during the procedure or recuperation. Informed consent has been obtained.  Description of procedure:  The patient was taken to the operating room and general anesthesia was induced.  The patient was placed in the supine position, prepped and draped in the usual sterile fashion, and preoperative antibiotics were administered. A preoperative time-out was performed.   A flexible 16 French Wolf cystoscope was advanced per urethra into the bladder. The urethra was carefully inspected and no urethral injuries or trauma was appreciated. The bladder is unremarkable. The scope was removed.  Next, the scrotum was carefully inspected and noted to be tense, fall, and bluish in coloration. There is a hematoma extending into the perineum limited by Colles' fascia. At this point in time, an approximately 8 cm midline incision was created vertically along the median raphae. Immediately, the oozing dark  ptosis was encountered. The incision was carried down until the left testicle was able to be exposed and delivered through the incision within the tunica vaginalis. The testicle itself was carefully palpated and there were no disruptions within tunica vaginalis over a suspected disruption and tunica albuginea. The same exact procedure was performed delivering the right testicle into the field as well by accessing it through the scrotal septum using Bovie electrocautery. This test was also unremarkable, no disruptions in tunica vaginalis and the testicle itself was palpably normal without disruption. In the posterior aspect of the scrotum, a large amount of clot and blood was evacuated, a total of 350 cc. The scrotum was then copiously irrigated. The entire scrotum was then carefully inspected for active bleeding. There was some oozing noted in the posterior aspect of the scrotum which was fulgurated using Bovie electrocautery. Ultimately, adequate hemostasis was achieved. A stab incision was created in the dependent aspect of the scrotum and a Penrose drain was placed within the scrotum and secured to the scrotal skin using a nylon suture. The testicles were delivered back into their correct anatomic position within the scrotum.  The dartos was then closed using a running 3-0 Vicryl suture.  The scrotal skin was closed using interrupted 4-0 chromic sutures. The incision was instilled with half Marcaine and half lidocaine, total of 10 cc. Dermabond was applied to the wound. 4 x 4's and ABDs pad was applied over the drain. Scrotal fluffs and a scrotal support devices were applied. He was then reversed from anesthesia and taken to the PACU in stable condition.   Plan: He'll be admitted overnight for observation and pain control. He'll likely be discharged tomorrow with a drain in place for several days.  Hollice Espy, M.D.

## 2016-05-18 NOTE — Anesthesia Post-op Follow-up Note (Cosign Needed)
Anesthesia QCDR form completed.        

## 2016-05-18 NOTE — ED Triage Notes (Signed)
Pt brought to room 17 via wheelchair.  Pt had surgery this morning on scrotum from a fall.  Pt has a drain in scrotum.  Pt can not void.  Pt has not voided since this morning.  Pt alert.  Pt pale.

## 2016-05-18 NOTE — Discharge Instructions (Signed)
Scrotal Hematoma Scrotal hematoma is the collection of blood inside the sac (scrotum) that contains the testicles, the blood vessels that serve the testicles, and the structures that help deliver sperm and semen. The rich blood supply in this area makes it easy for a significant amount of blood to collect in the scrotum even after a minor injury or a small operation like a vasectomy. In addition, the tissues in the scrotum are very loose. There is no pressure from the surrounding tissue to help stop bleeding once it starts. Follow these instructions at home: Monitor your hematoma for any changes. The following actions may help to alleviate any discomfort you are experiencing:  Apply ice to the injured area:  Put ice in a plastic bag.  Place a towel between your skin and the bag.  Leave the ice on for 20 minutes, 2-3 times a day.  Use scrotal support.  Take appropriate pain medications prescribed by your health care provider.  Minimize physical activity and avoid any activities that would place direct pressure on the scrotum and penis (such as bicycling and horseback riding).  Avoid sexual activity until advised otherwise by your health care provider.  If your health care provider has given you a follow-up appointment, it is very important to keep that appointment. Contact a health care provider if:  You have cloudy or dark urine.  You have frequent urination.  You develop scrotal swelling that does not improve as expected after 4 days. Get help right away if:  You develop recurrent or severe pain that is not controlled by prescribed medicine.  You have nausea or vomiting.  There is increased swelling of the scrotum.  You have increased abdominal pain.  You have a fever or chills.  You have difficulty starting urination.  You have painful urination.  Your urine flow is slow.  There is blood in your urine.  You are unable to urinate.  You experience redness spreading  from your scrotum into your groin or thighs. This information is not intended to replace advice given to you by your health care provider. Make sure you discuss any questions you have with your health care provider. Document Released: 03/27/2006 Document Revised: 08/12/2015 Document Reviewed: 05/30/2012 Elsevier Interactive Patient Education  2017 [employer]

## 2016-05-19 ENCOUNTER — Encounter: Payer: Self-pay | Admitting: Urology

## 2016-05-19 LAB — HIV ANTIBODY (ROUTINE TESTING W REFLEX): HIV Screen 4th Generation wRfx: NONREACTIVE

## 2016-05-22 ENCOUNTER — Encounter: Payer: Self-pay | Admitting: Urology

## 2016-05-22 ENCOUNTER — Ambulatory Visit (INDEPENDENT_AMBULATORY_CARE_PROVIDER_SITE_OTHER): Payer: Self-pay | Admitting: Urology

## 2016-05-22 ENCOUNTER — Emergency Department
Admission: EM | Admit: 2016-05-22 | Discharge: 2016-05-22 | Disposition: A | Discharge status: Home / Self Care | Payer: MEDICAID

## 2016-05-22 VITALS — BP 148/72 | HR 104

## 2016-05-22 DIAGNOSIS — R339 Retention of urine, unspecified: Secondary | ICD-10-CM

## 2016-05-22 DIAGNOSIS — S3022XA Contusion of scrotum and testes, initial encounter: Secondary | ICD-10-CM

## 2016-05-22 NOTE — Progress Notes (Signed)
05/22/2016 1:04 PM   Durene Cal Lacretia Nicks Chase Picket [birth year] [id number]  Referring provider: [facility] [address] [place], [place] [address]  Chief Complaint  Patient presents with  . Wound Check    HPI: 21 year old male status post scrotal exploration, evacuation of scrotal hematoma on 05/18/2016 who presents today for wound check and drain removal. He is voiding independently while inpatient, but once discharge had difficulty urinating. He returned to the emergency room and his catheter was replaced.  He continues to have some mild drainage from his scrotum, bloody in nature. No significant redness, warmth. He's been wearing a scrotal support. He's remained relatively inactive. He is yet to have a bowel movement since surgery.   PMH: History reviewed. No pertinent past medical history.  Surgical History: Past Surgical History:  Procedure Laterality Date  . CYSTOSCOPY  05/17/2016   Procedure: CYSTOSCOPY FLEXIBLE;  Surgeon: Vanna Scotland, MD;  Location: [facility] ORS;  Service: Urology;;  . HEMATOMA EVACUATION N/A 05/17/2016   Procedure: EVACUATION HEMATOMA- ;  Surgeon: Vanna Scotland, MD;  Location: [facility] ORS;  Service: Urology;  Laterality: N/A;  . SCROTAL EXPLORATION N/A 05/17/2016   Procedure: SCROTUM EXPLORATION;  Surgeon: Vanna Scotland, MD;  Location: [facility] ORS;  Service: Urology;  Laterality: N/A;    Home Medications:  Allergies as of 05/22/2016   No Known Allergies     Medication List       Accurate as of 05/22/16  1:04 PM. Always use your most recent med list.          docusate sodium 100 MG capsule Commonly known as:  COLACE Take 1 capsule (100 mg total) by mouth 2 (two) times daily.   oxyCODONE-acetaminophen 5-325 MG tablet Commonly known as:  PERCOCET Take 1-2 tablets by mouth every 4 (four) hours as needed for moderate pain or severe pain.       Allergies: No Known Allergies  Family History: History reviewed. No pertinent family  history.  Social History:  reports that he has been smoking.  He has never used smokeless tobacco. He reports that he drinks alcohol. He reports that he does not use drugs.  Physical Exam: BP (!) 148/72 (BP Location: Left Arm, Patient Position: Sitting, Cuff Size: Large)   Pulse (!) 104   Constitutional:  Alert and oriented, No acute distress. HEENT: [place] AT, moist mucus membranes.  Trachea midline, no masses. Cardiovascular: No clubbing, cyanosis, or edema. Respiratory: Normal respiratory effort, no increased work of breathing. GI: Abdomen is soft, nontender, nondistended, no abdominal masses XB:JYNWGNFA ecchymotic scrotum, mildly edematous. Ecchymosis extends into the perineum and gluteus.  Midline scrotal incision healing well, dermabond in place. Minimal penile swelling. Foley catheter removed today without difficulty.  Drain site with scant dark blood, suture in place. Drain removed today without difficulty. Skin: No rashes, bruises or suspicious lesions. Neurologic: Grossly intact, no focal deficits, moving all 4 extremities. Psychiatric: Normal mood and affect.  Laboratory Data: Lab Results  Component Value Date   WBC 17.1 (H) 05/17/2016   HGB 15.4 05/17/2016   HCT 44.6 05/17/2016   MCV 89.8 05/17/2016   PLT 330 05/17/2016    Lab Results  Component Value Date   CREATININE 0.95 05/17/2016    Urinalysis    Component Value Date/Time   COLORURINE YELLOW (A) 05/18/2016 2125   APPEARANCEUR CLEAR (A) 05/18/2016 2125   APPEARANCEUR Hazy 08/11/2013 2322   LABSPEC 1.010 05/18/2016 2125   LABSPEC 1.014 08/11/2013 2322   PHURINE 6.0 05/18/2016 2125   GLUCOSEU  NEGATIVE 05/18/2016 2125   GLUCOSEU Negative 08/11/2013 2322   HGBUR NEGATIVE 05/18/2016 2125   BILIRUBINUR NEGATIVE 05/18/2016 2125   BILIRUBINUR Negative 08/11/2013 2322   KETONESUR 5 (A) 05/18/2016 2125   PROTEINUR NEGATIVE 05/18/2016 2125   NITRITE NEGATIVE 05/18/2016 2125   LEUKOCYTESUR NEGATIVE 05/18/2016 2125    LEUKOCYTESUR Negative 08/11/2013 2322    Pertinent Imaging: n/a  Assessment & Plan:   1. Scrotal hematoma Drain removed Continue scrotal support Patient advised that healing process will be lengthy Warning symptoms reviewed Bowel regimen recommended Advised to anticipate drainage from previous drain site for several days until skin closes  2. Urinary retention Bladder filled today, unable to void Advised to return later today for unable to urinate.  There are no diagnoses linked to this encounter.  Return in about 4 weeks (around 06/19/2016) for wound check.  Vanna ScotlandAshley Amila Callies, MD  [facility] [address], [address] [place], [place] [address] [phone]

## 2016-05-22 NOTE — Progress Notes (Signed)
Fill and Pull Catheter Removal  Patient is present today for a catheter removal.  Patient was cleaned and prepped in a sterile fashion 240ml of sterile water/ saline was instilled into the bladder when the patient felt the urge to urinate. 5ml of water was then drained from the balloon.  A 14FR foley cath was removed from the bladder no complications were noted .  Patient as then given some time to void on their own.  Patient cannot void on their own after some time.  Patient tolerated well.  Preformed by: Eligha BridegroomSarah Georgiana Spillane, CMA and Doree Barthelasandra Lowe, CMA  Follow up: Patient is to return this afternoon at 3:00 if he is unable to void on his own.

## 2016-05-23 ENCOUNTER — Telehealth: Payer: Self-pay

## 2016-05-23 NOTE — Telephone Encounter (Signed)
Pt called after hours triage line c/o running a fever post drain removal and foley removal/replacement. Spoke with pt in reference to fever. Pt stated that he has been taking tylenol and his temp has been around 98.9-99.1. Reinforced with pt s/s of infection. Reinforced with pt to continue to drink plenty of fluids. Pt voiced understanding of whole conversation.

## 2016-05-24 ENCOUNTER — Telehealth: Payer: Self-pay | Admitting: Radiology

## 2016-05-24 NOTE — Progress Notes (Signed)
Bladder Scan Patient cannot void: 875 ml Performed By: Eligha BridegroomSarah Lakeya Mulka, CMA  Simple Catheter Placement  Due to urinary retention patient is present today for a foley cath placement.  Patient was cleaned and prepped in a sterile fashion with betadine and lidocaine jelly 2% was instilled into the urethra.  A 16 FR foley catheter was inserted, urine return was noted  1000ml, urine was clear yellow in color.  The balloon was filled with 10cc of sterile water.  A leg bag was attached for drainage. Patient was also given a night bag to take home and was given instruction on how to change from one bag to another.  Patient was given instruction on proper catheter care.  Patient tolerated well, no complications were noted   Preformed by: Eligha BridegroomSarah Latamara Melder, CMA  Additional notes/ Follow up: 1 week voiding trial

## 2016-05-24 NOTE — Telephone Encounter (Signed)
Pt called stating he woke up with an erection this morning & it caused the tip of his penis to burn with irritation. States sensation resolved when erection went down. Pt also c/o scrotal aching, "not really pain". Reinforced with pt that it will take time for wound to heal & the s/sx of infection & advised pt to call back if the irritation became worse or didn't improve. Pt voices understanding. Pt called again asking if he can clean his scrotum. Pt states he thinks he still has stitches at the top of the incision. When pt was advised that he should not take a shower until after the stitches were removed he stated that Dr Apolinar JunesBrandon told him he could take a shower but not a bath. Advised pt to shower & gently clean wound with soap & water. Pt voices understanding.

## 2016-05-24 NOTE — Telephone Encounter (Signed)
Pt called stating he has not had a bowel movement since surgery & that he had started taking a stool softener & milk of magnesia yesterday. Advised pt that it takes a few days for the stool softener to work. Advised pt that he may also take dulcolax if needed. Pt voices understanding.

## 2016-05-25 ENCOUNTER — Telehealth: Payer: Self-pay | Admitting: Radiology

## 2016-05-25 NOTE — Telephone Encounter (Signed)
Pt states that after using a suppository, he had a large bowel movement today. This is the first bowel movement since surgery & he is experiencing abdominal pain. Pt asks if this is to be expected. Advised pt that it was normal to have some discomfort but to call back if discomfort worsens or doesn't improve. Pt voices understanding.

## 2016-05-25 NOTE — Telephone Encounter (Signed)
Pt states when he removes dressing it pulls the scab off which is painful. Encouraged pt to moisten dressing with water prior & gently remove dressing to avoid pulling off scab. Pt voices understanding.

## 2016-05-26 ENCOUNTER — Telehealth: Payer: Self-pay | Admitting: Radiology

## 2016-05-26 NOTE — Telephone Encounter (Signed)
Pt states he has mild intermittant bleeding from urethral meatus when straining to use the bathroom. Advised pt this is caused by irritation from the catheter & straining. Encouraged pt to call back if bleeding gets worse or doesn't stop. Pt states he called the after hours line & was advised of the same answer & the nurse suggested asking if Dr Apolinar JunesBrandon would want to remove the catheter today. Pt states he does not want to remove the catheter today as there is still significant swelling of his testicles. Advised pt to keep appt for voiding trial on 05/29/16. Pt voices understanding.

## 2016-05-28 ENCOUNTER — Encounter: Payer: Self-pay | Admitting: Emergency Medicine

## 2016-05-28 ENCOUNTER — Emergency Department
Admission: EM | Admit: 2016-05-28 | Discharge: 2016-05-29 | Disposition: A | Discharge status: Home / Self Care | Payer: Self-pay | Attending: Emergency Medicine | Admitting: Emergency Medicine

## 2016-05-28 DIAGNOSIS — F17200 Nicotine dependence, unspecified, uncomplicated: Secondary | ICD-10-CM | POA: Insufficient documentation

## 2016-05-28 DIAGNOSIS — R1904 Left lower quadrant abdominal swelling, mass and lump: Secondary | ICD-10-CM | POA: Insufficient documentation

## 2016-05-28 DIAGNOSIS — S3022XD Contusion of scrotum and testes, subsequent encounter: Secondary | ICD-10-CM | POA: Insufficient documentation

## 2016-05-28 DIAGNOSIS — X58XXXD Exposure to other specified factors, subsequent encounter: Secondary | ICD-10-CM | POA: Insufficient documentation

## 2016-05-28 LAB — COMPREHENSIVE METABOLIC PANEL
ALBUMIN: 4 g/dL (ref 3.5–5.0)
ALK PHOS: 62 U/L (ref 38–126)
ALT: 55 U/L (ref 17–63)
ANION GAP: 9 (ref 5–15)
AST: 39 U/L (ref 15–41)
BUN: 16 mg/dL (ref 6–20)
CALCIUM: 9.2 mg/dL (ref 8.9–10.3)
CHLORIDE: 105 mmol/L (ref 101–111)
CO2: 25 mmol/L (ref 22–32)
Creatinine, Ser: 0.87 mg/dL (ref 0.61–1.24)
GFR calc Af Amer: >60 mL/min (ref >60)
GFR calc non Af Amer: >60 mL/min (ref >60)
GLUCOSE: 110 mg/dL — AB (ref 65–99)
POTASSIUM: 3.4 mmol/L — AB (ref 3.5–5.1)
SODIUM: 139 mmol/L (ref 135–145)
Total Bilirubin: 0.5 mg/dL (ref 0.3–1.2)
Total Protein: 7.5 g/dL (ref 6.5–8.1)

## 2016-05-28 LAB — CBC WITH DIFFERENTIAL/PLATELET
BASOS ABS: 0.1 10*3/uL (ref 0–0.1)
BASOS PCT: 1 %
EOS ABS: 0.2 10*3/uL (ref 0–0.7)
Eosinophils Relative: 2 %
HCT: 33.1 % — ABNORMAL LOW (ref 40.0–52.0)
HEMOGLOBIN: 11.6 g/dL — AB (ref 13.0–18.0)
Lymphocytes Relative: 27 %
Lymphs Abs: 2.9 10*3/uL (ref 1.0–3.6)
MCH: 31.3 pg (ref 26.0–34.0)
MCHC: 34.9 g/dL (ref 32.0–36.0)
MCV: 89.7 fL (ref 80.0–100.0)
MONO ABS: 1.3 10*3/uL — AB (ref 0.2–1.0)
Monocytes Relative: 11 %
NEUTROS ABS: 6.6 10*3/uL — AB (ref 1.4–6.5)
NEUTROS PCT: 59 %
PLATELETS: 730 10*3/uL — AB (ref 150–440)
RBC: 3.7 MIL/uL — ABNORMAL LOW (ref 4.40–5.90)
RDW: 13.8 % (ref 11.5–14.5)
WBC: 11.1 10*3/uL — ABNORMAL HIGH (ref 3.8–10.6)

## 2016-05-28 NOTE — ED Triage Notes (Signed)
Pt arrived via pov. Pt had scrotum surgery for hematoma removal 2 weeks ago. Pt had to have a folley placed last week do to urinary retention and constipation. Today pt noticed moderate swelling to left side of groin. Pt reports before surgery only left side of scrotum would become swollen. Pt states he has some mild pain in the area but thought it might be from "over doing it today."

## 2016-05-29 ENCOUNTER — Emergency Department: Payer: Self-pay

## 2016-05-29 ENCOUNTER — Ambulatory Visit (INDEPENDENT_AMBULATORY_CARE_PROVIDER_SITE_OTHER): Payer: Self-pay

## 2016-05-29 VITALS — BP 134/73 | HR 85 | Ht 66.0 in | Wt 168.1 lb

## 2016-05-29 DIAGNOSIS — S3022XA Contusion of scrotum and testes, initial encounter: Secondary | ICD-10-CM

## 2016-05-29 LAB — URINALYSIS, COMPLETE (UACMP) WITH MICROSCOPIC
BILIRUBIN URINE: NEGATIVE
Glucose, UA: NEGATIVE mg/dL
Ketones, ur: NEGATIVE mg/dL
LEUKOCYTES UA: NEGATIVE
NITRITE: NEGATIVE
Protein, ur: 30 mg/dL — AB
SPECIFIC GRAVITY, URINE: 1.029 (ref 1.005–1.030)
SQUAMOUS EPITHELIAL / LPF: NONE SEEN
pH: 5 (ref 5.0–8.0)

## 2016-05-29 MED ORDER — IOPAMIDOL (ISOVUE-300) INJECTION 61%
100.0000 mL | Freq: Once | INTRAVENOUS | Status: AC | PRN
  Administered 2016-05-29: 100 mL via INTRAVENOUS

## 2016-05-29 NOTE — ED Notes (Signed)
Reviewed d/c instructions and follow-up care with patient. Patient verbalized understanding.

## 2016-05-29 NOTE — Progress Notes (Signed)
Fill and Pull Catheter Removal  Patient is present today for a catheter removal.  Patient was cleaned and prepped in a sterile fashion 210ml of sterile water/ saline was instilled into the bladder when the patient felt the urge to urinate. 10ml of water was then drained from the balloon.  A 16FR foley cath was removed from the bladder no complications were noted .  Patient as then given some time to void on their own.  Patient can void  200ml on their own after some time.  Patient tolerated well.  Preformed by: C. Rana SnareLowe, CMA

## 2016-05-29 NOTE — ED Notes (Signed)
ED Provider at bedside. 

## 2016-05-29 NOTE — ED Notes (Addendum)
Patient scrotal surgery on May 10th to remove hematoma.Twelve ounces of fluid was removed and a drain was placed. The drain was removed. 5/14.  Patient was ordered to wear Jock strap when up and moving after the surgery for support. Patient reports swelling and pain had not been an issue since aprox 5/17.  Patient reports he had increased level of activity today for the first time since the surgery.  Patient began to experience scrotal pain, and noticed left scrotal swelling. Pt applied ice and the swelling resolved initially, however came back once the ice was removed.   Patient reports he took off his Jock strap in the waiting room and most of his pain has since resolved.  Patient reports he is scheduled to have his catheter removed tomorrow.

## 2016-05-29 NOTE — ED Provider Notes (Signed)
[facility] Emergency Department Provider Note    First MD Initiated Contact with Patient 05/28/16 2358     (approximate)  I have reviewed the triage vital signs and the nursing notes.   HISTORY  Chief Complaint Groin Swelling    HPI Rodney Leblanc is a 21 y.o. male with history of traumatic scrotal hematoma status post surgical evacuation on 05/18/2016 by Dr. Apolinar Junes presents to the emergency department with increase in size of the left scrotum which he noted today. Patient denies any pain in the area or any increase in pain. Patient denies any fever, no nausea vomiting. Patient states that his scrotum is overall improved in size and discomfort since surgery. Patient states it was the size of a basketball before   History reviewed. No pertinent past medical history.  Patient Active Problem List   Diagnosis Date Noted  . Traumatic scrotal hematoma 05/18/2016  . Traumatic scrotal hematoma, initial encounter     Past Surgical History:  Procedure Laterality Date  . CYSTOSCOPY  05/17/2016   Procedure: CYSTOSCOPY FLEXIBLE;  Surgeon: Vanna Scotland, MD;  Location: [facility] ORS;  Service: Urology;;  . HEMATOMA EVACUATION N/A 05/17/2016   Procedure: EVACUATION HEMATOMA- ;  Surgeon: Vanna Scotland, MD;  Location: [facility] ORS;  Service: Urology;  Laterality: N/A;  . SCROTAL EXPLORATION N/A 05/17/2016   Procedure: SCROTUM EXPLORATION;  Surgeon: Vanna Scotland, MD;  Location: [facility] ORS;  Service: Urology;  Laterality: N/A;    Prior to Admission medications   Medication Sig Start Date End Date Taking? Authorizing Provider  docusate sodium (COLACE) 100 MG capsule Take 1 capsule (100 mg total) by mouth 2 (two) times daily. 05/18/16   Vanna Scotland, MD  oxyCODONE-acetaminophen (PERCOCET) 5-325 MG tablet Take 1-2 tablets by mouth every 4 (four) hours as needed for moderate pain or severe pain. 05/18/16   Vanna Scotland, MD    Allergies No known drug  allergies   Family history Noncontributory  Social History Social History  Substance Use Topics  . Smoking status: Current Every Day Smoker  . Smokeless tobacco: Never Used  . Alcohol use Yes    Review of Systems Constitutional: No fever/chills Eyes: No visual changes. ENT: No sore throat. Cardiovascular: Denies chest pain. Respiratory: Denies shortness of breath. Gastrointestinal: No abdominal pain.  No nausea, no vomiting.  No diarrhea.  No constipation. Genitourinary: Negative for dysuria. Positive for left scrotal swelling Musculoskeletal: Negative for neck pain.  Negative for back pain. Integumentary: Negative for rash. Neurological: Negative for headaches, focal weakness or numbness.   ____________________________________________   PHYSICAL EXAM:  VITAL SIGNS: ED Triage Vitals  Enc Vitals Group     BP 05/28/16 2250 (!) 158/79     Pulse Rate 05/28/16 2250 (!) 104     Resp 05/28/16 2250 18     Temp 05/28/16 2250 98.5 F (36.9 C)     Temp Source 05/28/16 2250 Oral     SpO2 05/28/16 2250 100 %     Weight 05/28/16 2250 185 lb (83.9 kg)     Height 05/28/16 2250 5\' 6"  (1.676 m)     Head Circumference --      Peak Flow --      Pain Score 05/28/16 2249 2     Pain Loc --      Pain Edu? --      Excl. in GC? --      Constitutional: Alert and oriented. Well appearing and in no acute distress. Eyes: Conjunctivae are normal.  Head: Atraumatic. Mouth/Throat: Mucous membranes are moist.  Oropharynx non-erythematous. Neck: No stridor.   Cardiovascular: Normal rate, regular rhythm. Good peripheral circulation. Grossly normal heart sounds. Respiratory: Normal respiratory effort.  No retractions. Lungs CTAB. Gastrointestinal: Soft and nontender. No distention.  Genitourinary: Bilateral scrotal swelling with overlying ecchymoses left greater than right. Musculoskeletal: No lower extremity tenderness nor edema. No gross deformities of extremities. Neurologic:  Normal  speech and language. No gross focal neurologic deficits are appreciated.  Skin:  Skin is warm, dry and intact. No rash noted. Psychiatric: Mood and affect are normal. Speech and behavior are normal.  ____________________________________________   LABS (all labs ordered are listed, but only abnormal results are displayed)  Labs Reviewed  CBC WITH DIFFERENTIAL/PLATELET - Abnormal; Notable for the following:       Result Value   WBC 11.1 (*)    RBC 3.70 (*)    Hemoglobin 11.6 (*)    HCT 33.1 (*)    Platelets 730 (*)    Neutro Abs 6.6 (*)    Monocytes Absolute 1.3 (*)    All other components within normal limits  COMPREHENSIVE METABOLIC PANEL - Abnormal; Notable for the following:    Potassium 3.4 (*)    Glucose, Bld 110 (*)    All other components within normal limits  URINALYSIS, COMPLETE (UACMP) WITH MICROSCOPIC - Abnormal; Notable for the following:    Color, Urine YELLOW (*)    APPearance TURBID (*)    Hgb urine dipstick MODERATE (*)    Protein, ur 30 (*)    Bacteria, UA RARE (*)    All other components within normal limits     RADIOLOGY I, Ames N Antionette Luster, personally viewed and evaluated these images (plain radiographs) as part of my medical decision making, as well as reviewing the written report by the radiologist.  Ct Pelvis W Contrast  Result Date: 05/29/2016 CLINICAL DATA:  Scrotal swelling EXAM: CT PELVIS WITH CONTRAST TECHNIQUE: Multidetector CT imaging of the pelvis was performed using the standard protocol following the bolus administration of intravenous contrast. CONTRAST:  100mL ISOVUE-300 IOPAMIDOL (ISOVUE-300) INJECTION 61% COMPARISON:  Scrotal ultrasound 05/17/2016 FINDINGS: Urinary Tract:  There is a Foley catheter in the urinary bladder. Bowel:  Visualized bowel is normal. Vascular/Lymphatic: Normal Reproductive: There is heterogeneous material filling the scrotum, left-greater-than-right, consistent with hematoma. Blood extends superiorly into the left  inguinal canal. Other:  None Musculoskeletal: No suspicious bone lesions identified. IMPRESSION: Recurrent scrotal hematoma, left-greater-than-right, with extension of a small amount of blood into the left inguinal canal. A Electronically Signed   By: Deatra RobinsonKevin  Herman M.D.   On: 05/29/2016 01:48     Procedures   ____________________________________________   INITIAL IMPRESSION / ASSESSMENT AND PLAN / ED COURSE  Pertinent labs & imaging results that were available during my care of the patient were reviewed by me and considered in my medical decision making (see chart for details).  Patient discussed with Dr. Apolinar JunesBrandon urologist on call who agreed with plan to discharge patient home with outpatient follow-up as planned      ____________________________________________  FINAL CLINICAL IMPRESSION(S) / ED DIAGNOSES  Final diagnoses:  Traumatic scrotal hematoma, subsequent encounter     MEDICATIONS GIVEN DURING THIS VISIT:  Medications  iopamidol (ISOVUE-300) 61 % injection 100 mL (100 mLs Intravenous Contrast Given 05/29/16 0123)     NEW OUTPATIENT MEDICATIONS STARTED DURING THIS VISIT:  New Prescriptions   No medications on file    Modified Medications   No medications on file  Discontinued Medications   No medications on file     Note:  This document was prepared using Dragon voice recognition software and may include unintentional dictation errors.    Darci Current, MD 05/29/16 [phone]

## 2016-05-30 ENCOUNTER — Telehealth: Payer: Self-pay | Admitting: Radiology

## 2016-05-30 NOTE — Telephone Encounter (Signed)
Notified pt of note below from Dr Apolinar JunesBrandon & that she would be happy to discuss it further with the officer, if needed, with permission from pt. Pt voices understanding.

## 2016-05-30 NOTE — Telephone Encounter (Signed)
Pt states he has a warrant & has a meeting with the police officer regarding his current condition. Pt requests a letter stating pt is restricted to home due to his condition, requires help getting out of bed & to the bathroom, must wear an athletic supporter & bandages & is not fit to be in jail at this time. Please advise.

## 2016-05-30 NOTE — Telephone Encounter (Signed)
Now that he is voiding independently without a catheter and his drain is removed, there is no contraindication for jail. He should continue to wear scrotal support device but this can be done in jail. Unfortunately, I'm not able to interfere with the Justice process in this way.  I'd be happy to discuss it further with the officer as needed if permission is granted.    Vanna ScotlandAshley Tarence Searcy, MD

## 2016-06-06 ENCOUNTER — Encounter: Payer: Self-pay | Admitting: Urology

## 2016-06-07 ENCOUNTER — Encounter: Payer: Self-pay | Admitting: Urology

## 2016-06-07 ENCOUNTER — Ambulatory Visit (INDEPENDENT_AMBULATORY_CARE_PROVIDER_SITE_OTHER): Payer: Self-pay | Admitting: Urology

## 2016-06-07 VITALS — BP 111/82 | HR 114 | Ht 66.0 in | Wt 165.0 lb

## 2016-06-07 DIAGNOSIS — S3022XA Contusion of scrotum and testes, initial encounter: Secondary | ICD-10-CM

## 2016-06-07 DIAGNOSIS — F411 Generalized anxiety disorder: Secondary | ICD-10-CM

## 2016-06-07 NOTE — Progress Notes (Signed)
06/07/2016 5:44 PM   Rodney Leblanc 03/20/1995 [id number]  Referring provider: [facility] [address] [place], [place] [address]  No chief complaint on file.   HPI: 21 year old male status post scrotal expiration, evacuation of scrotal hematoma on 05/18/2016 who returns to the office today as a walk-in.  Overall, he has been extremely anxious about his medical condition and has called the answering service under routine basis in the evenings as well as been seen in the emergency room for minor concerns.  Overall, his hematoma is resolving spontaneously with dramatic improvement in the overall size and comfort level. He is voiding spontaneously without catheter. He occasionally has some minimal drainage from the previous drain site which comes and goes. No bright redness or drainage. No fevers or chills.  Today, he is concerned that he may have a hernia. He was reading online and notes that he feels a more palpable mass in the left base of his scrotum which was not previously palpable.  He did have a CT scan in the ER on 05/28/2016 which showed no evidence of inguinal hernia.   PMH: No past medical history on file.  Surgical History: Past Surgical History:  Procedure Laterality Date  . CYSTOSCOPY  05/17/2016   Procedure: CYSTOSCOPY FLEXIBLE;  Surgeon: Vanna Scotland, MD;  Location: [facility] ORS;  Service: Urology;;  . HEMATOMA EVACUATION N/A 05/17/2016   Procedure: EVACUATION HEMATOMA- ;  Surgeon: Vanna Scotland, MD;  Location: [facility] ORS;  Service: Urology;  Laterality: N/A;  . SCROTAL EXPLORATION N/A 05/17/2016   Procedure: SCROTUM EXPLORATION;  Surgeon: Vanna Scotland, MD;  Location: [facility] ORS;  Service: Urology;  Laterality: N/A;    Home Medications:  Allergies as of 06/07/2016   No Known Allergies     Medication List    as of 06/07/2016  5:44 PM   You have not been prescribed any medications.     Allergies: No Known Allergies  Family  History: No family history on file.  Social History:  reports that he has been smoking.  He has never used smokeless tobacco. He reports that he drinks alcohol. He reports that he does not use drugs.  ROS: 12 point review systems was performed and is otherwise negative.  Physical Exam: BP 111/82 (BP Location: Left Arm, Patient Position: Sitting, Cuff Size: Normal)   Pulse (!) 114   Ht 5\' 6"  (1.676 m)   Wt 165 lb (74.8 kg)   BMI 26.63 kg/m   Constitutional:  Alert and oriented, No acute distress.  Anxious, presents with girlfriend again today.  HEENT: [place] AT, moist mucus membranes.  Trachea midline, no masses. Cardiovascular: No clubbing, cyanosis, or edema. Respiratory: Normal respiratory effort, no increased work of breathing. GI: Abdomen is soft, nontender, nondistended, no abdominal masses GU: Circumcised phallus with resolution of penile edema. Continued improvement of scrotal hematoma with significant reduction in scrotal edema. Drain site appears to be clean dry and intact. Midline incision with Dermabond starting to peel, slight separation at the lower pole of the incision but no evidence of significant dehiscence or infection.  Skin: No rashes, bruises or suspicious lesions. Lymph: No inguinal adenopathy. Neurologic: Grossly intact, no focal deficits, moving all 4 extremities. Psychiatric: Normal mood and affect.  Laboratory Data: Lab Results  Component Value Date   WBC 11.1 (H) 05/28/2016   HGB 11.6 (L) 05/28/2016   HCT 33.1 (L) 05/28/2016   MCV 89.7 05/28/2016   PLT 730 (H) 05/28/2016    Lab Results  Component  Value Date   CREATININE 0.87 05/28/2016    Pertinent Imaging: CT scan of the pelvis with contrast on 05/29/2016 was personally reviewed today.  Assessment & Plan:    1. Scrotal hematoma Dramatic improvement in size of scrotum with reduction in penoscrotal edema Discussed natural history of scrotal hematoma Continue supportive care concern for  infection, complications, or any other new pathology  2. Anxiety reaction Lengthy discussion today about overall anxiety regarding his medical condition Recommend avoiding google/ blogs and other non-reputable sources for information as these seem to be contributing to his anxiety level Patient was repeatedly reassured today  F/u prn  Vanna ScotlandAshley Oval Moralez, MD  [facility] [address], [address] [place], [place] [address] [phone]

## 2016-06-23 ENCOUNTER — Encounter: Payer: Self-pay | Admitting: Urology

## 2016-06-23 ENCOUNTER — Ambulatory Visit: Payer: Self-pay | Admitting: Urology

## 2016-07-28 ENCOUNTER — Ambulatory Visit: Payer: Self-pay | Admitting: Urology

## 2016-12-19 ENCOUNTER — Emergency Department: Payer: Self-pay

## 2016-12-19 ENCOUNTER — Emergency Department
Admission: EM | Admit: 2016-12-19 | Discharge: 2016-12-19 | Disposition: A | Discharge status: Home / Self Care | Payer: Self-pay | Attending: Emergency Medicine | Admitting: Emergency Medicine

## 2016-12-19 ENCOUNTER — Encounter: Payer: Self-pay | Admitting: Emergency Medicine

## 2016-12-19 DIAGNOSIS — F17210 Nicotine dependence, cigarettes, uncomplicated: Secondary | ICD-10-CM | POA: Insufficient documentation

## 2016-12-19 DIAGNOSIS — N50811 Right testicular pain: Secondary | ICD-10-CM | POA: Insufficient documentation

## 2016-12-19 DIAGNOSIS — N5082 Scrotal pain: Secondary | ICD-10-CM

## 2016-12-19 NOTE — ED Notes (Signed)
See triage note  states he had some testicular pain on Friday   States he noticed pain to area after lifting  Denies any other injury

## 2016-12-19 NOTE — ED Notes (Signed)
Ultra sound tech informs this nurse that he is refusing this test  denies any pain

## 2016-12-19 NOTE — ED Triage Notes (Signed)
Patient presents to the ED stating that he had right sided testicular pain on Friday for several hours after lifting a heavy trashcan.  Patient reports having surgery for a hematoma in his scrotum in May.  Patient states, "I just wanted to get things checked out because I never went for my follow-up after the surgery."  Patient denies any pain, swelling or discoloration at this time.  Patient states, "I also need a note for work since I had to leave on Friday."

## 2016-12-19 NOTE — ED Provider Notes (Signed)
[facility] Emergency Department Provider Note   ____________________________________________   First MD Initiated Contact with Patient 12/19/16 1614     (approximate)  I have reviewed the triage vital signs and the nursing notes.   HISTORY  Chief Complaint Letter for School/Work and Testicle Pain    HPI Rodney Leblanc is a 21 y.o. male patient presents for return to work note. Patient stated 4 days ago he lifted a heavy trashcan and felt right sided testicle pain. The pain lasted several hours. Patient was initially concerned because his surgery for hematoma in the scrotum which was surgically resolved and may of this year. Patient state he was also follow-up after he had his stents removed but did not because he felt better. Denies any pain at this time patient denies any swelling or BRUISING. Patient state he just needs a return to work note since he left early Friday secondary to the pain.   History reviewed. No pertinent past medical history.  Patient Active Problem List   Diagnosis Date Noted  . Traumatic scrotal hematoma 05/18/2016  . Traumatic scrotal hematoma, initial encounter     Past Surgical History:  Procedure Laterality Date  . CYSTOSCOPY  05/17/2016   Procedure: CYSTOSCOPY FLEXIBLE;  Surgeon: Vanna ScotlandBrandon, Ashley, MD;  Location: [facility] ORS;  Service: Urology;;  . HEMATOMA EVACUATION N/A 05/17/2016   Procedure: EVACUATION HEMATOMA- 350ML;  Surgeon: Vanna ScotlandBrandon, Ashley, MD;  Location: [facility] ORS;  Service: Urology;  Laterality: N/A;  . SCROTAL EXPLORATION N/A 05/17/2016   Procedure: SCROTUM EXPLORATION;  Surgeon: Vanna ScotlandBrandon, Ashley, MD;  Location: [facility] ORS;  Service: Urology;  Laterality: N/A;    Prior to Admission medications   Not on File    Allergies Patient has no known allergies.  No family history on file.  Social History Social History   Tobacco Use  . Smoking status: Current Every Day Smoker  . Smokeless tobacco: Never Used    Substance Use Topics  . Alcohol use: Yes  . Drug use: No    Review of Systems  Constitutional: No fever/chills Eyes: No visual changes. ENT: No sore throat. Cardiovascular: Denies chest pain. Respiratory: Denies shortness of breath. Gastrointestinal: No abdominal pain.  No nausea, no vomiting.  No diarrhea.  No constipation. Genitourinary: Negative for dysuria. Resolved score complaint Musculoskeletal: Negative for back pain. Skin: Negative for rash. Neurological: Negative for headaches, focal weakness or numbness.   ____________________________________________   PHYSICAL EXAM:  VITAL SIGNS: ED Triage Vitals  Enc Vitals Group     BP 12/19/16 1549 (!) 147/73     Pulse Rate 12/19/16 1549 (!) 55     Resp 12/19/16 1549 16     Temp 12/19/16 1549 98.2 F (36.8 C)     Temp Source 12/19/16 1549 Oral     SpO2 12/19/16 1549 99 %     Weight 12/19/16 1549 190 lb (86.2 kg)     Height 12/19/16 1549 5\' 6"  (1.676 m)     Head Circumference --      Peak Flow --      Pain Score 12/19/16 1553 0     Pain Loc --      Pain Edu? --      Excl. in GC? --     Constitutional: Alert and oriented. Well appearing and in no acute distress. Cardiovascular: Normal rate, regular rhythm. Grossly normal heart sounds.  Good peripheral circulation. Respiratory: Normal respiratory effort.  No retractions. Lungs CTAB. Gastrointestinal: Soft and nontender. No distention. No  abdominal bruits. No CVA tenderness. Genitourinary: No scrotum edema erythema or ecchymosis Neurologic:  Normal speech and language. No gross focal neurologic deficits are appreciated. No gait instability. Skin:  Skin is warm, dry and intact. No rash noted. Psychiatric: Mood and affect are normal. Speech and behavior are normal.  ____________________________________________   LABS (all labs ordered are listed, but only abnormal results are displayed)  Labs Reviewed - No data to  display ____________________________________________  EKG   ____________________________________________  RADIOLOGY  No results found.  ____________________________________________   PROCEDURES  Procedure(s) performed: None  Procedures  Critical Care performed: No  ____________________________________________   INITIAL IMPRESSION / ASSESSMENT AND PLAN / ED COURSE  As part of my medical decision making, I reviewed the following data within the electronic MEDICAL RECORD NUMBER    Resolved scrotum pain. Patient given discharge care instructions and a return to work note.      ____________________________________________   FINAL CLINICAL IMPRESSION(S) / ED DIAGNOSES  Final diagnoses:  Pain in scrotum     ED Discharge Orders    None       Note:  This document was prepared using Dragon voice recognition software and may include unintentional dictation errors.    Joni ReiningSmith, Jakyia Gaccione K, PA-C 12/19/16 1702    Loleta RoseForbach, Cory, MD 12/19/16 [phone]

## 2017-03-11 ENCOUNTER — Emergency Department
Admission: EM | Admit: 2017-03-11 | Discharge: 2017-03-11 | Disposition: A | Discharge status: Home / Self Care | Payer: Self-pay | Attending: Emergency Medicine | Admitting: Emergency Medicine

## 2017-03-11 ENCOUNTER — Encounter: Payer: Self-pay | Admitting: Emergency Medicine

## 2017-03-11 DIAGNOSIS — K2100 Gastro-esophageal reflux disease with esophagitis, without bleeding: Secondary | ICD-10-CM

## 2017-03-11 DIAGNOSIS — F17200 Nicotine dependence, unspecified, uncomplicated: Secondary | ICD-10-CM | POA: Insufficient documentation

## 2017-03-11 LAB — COMPREHENSIVE METABOLIC PANEL
ALK PHOS: 67 U/L (ref 38–126)
ALT: 42 U/L (ref 17–63)
AST: 27 U/L (ref 15–41)
Albumin: 4.5 g/dL (ref 3.5–5.0)
Anion gap: 9 (ref 5–15)
BILIRUBIN TOTAL: 0.9 mg/dL (ref 0.3–1.2)
BUN: 7 mg/dL (ref 6–20)
CALCIUM: 9.1 mg/dL (ref 8.9–10.3)
CO2: 23 mmol/L (ref 22–32)
CREATININE: 0.79 mg/dL (ref 0.61–1.24)
Chloride: 107 mmol/L (ref 101–111)
GFR calc Af Amer: >60 mL/min (ref >60)
Glucose, Bld: 110 mg/dL — ABNORMAL HIGH (ref 65–99)
POTASSIUM: 3.7 mmol/L (ref 3.5–5.1)
Sodium: 139 mmol/L (ref 135–145)
TOTAL PROTEIN: 7.2 g/dL (ref 6.5–8.1)

## 2017-03-11 LAB — CBC
HEMATOCRIT: 47.4 % (ref 40.0–52.0)
Hemoglobin: 16.1 g/dL (ref 13.0–18.0)
MCH: 31.4 pg (ref 26.0–34.0)
MCHC: 33.9 g/dL (ref 32.0–36.0)
MCV: 92.4 fL (ref 80.0–100.0)
PLATELETS: 250 10*3/uL (ref 150–440)
RBC: 5.13 MIL/uL (ref 4.40–5.90)
RDW: 13.2 % (ref 11.5–14.5)
WBC: 5.2 10*3/uL (ref 3.8–10.6)

## 2017-03-11 LAB — LIPASE, BLOOD: Lipase: 45 U/L (ref 11–51)

## 2017-03-11 MED ORDER — SUCRALFATE 1 G PO TABS: 1.0000 g | ORAL_TABLET | Freq: Four times a day (QID) | ORAL | 0 refills | Status: DC

## 2017-03-11 MED ORDER — FAMOTIDINE 40 MG PO TABS: 40.0000 mg | ORAL_TABLET | Freq: Every evening | ORAL | 1 refills | Status: DC

## 2017-03-11 NOTE — ED Notes (Signed)
Pt able to ambulate independently at time of discharge.

## 2017-03-11 NOTE — ED Triage Notes (Signed)
Pt comes into the ED via POV c/o emesis and acid reflux sensation in the upper abdomin.  Patient denies any abdominal pain, but he states he can feel the burning sensation int he back of his throat and in his mouth.  Patient states that the past week it has increased to vomiting on a regular basis.  Denies any coffee ground look to the emesis.  Patient in NAD at this time with even and unlabored respirations.

## 2017-03-11 NOTE — Discharge Instructions (Signed)
Please seek medical attention for any high fevers, chest pain, shortness of breath, change in behavior, persistent vomiting, bloody stool or any other new or concerning symptoms.  

## 2017-03-11 NOTE — ED Notes (Signed)
MD at bedside at this time.

## 2017-03-11 NOTE — ED Provider Notes (Signed)
[facility] Emergency Department Provider Note  ____________________________________________   I have reviewed the triage vital signs and the nursing notes.   HISTORY  Chief Complaint Heartburn and Emesis   History limited by: Not Limited   HPI Rodney Leblanc is a 22 y.o. male who presents to the emergency department today because of concerns for acid reflux.  He states he has had symptoms for a number of years.  For the past few months and now the past 4 days it has become more severe.  He has a sensation of burning coming up his chest.  He is also now developed some vomiting with this.  He has tried taking some Tums.  He denies ever being on any antacids. Denies alcohol or OTC pain medication use.  Per medical record review patient has a history of scrotal hematoma.   History reviewed. No pertinent past medical history.  Patient Active Problem List   Diagnosis Date Noted  . Traumatic scrotal hematoma 05/18/2016  . Traumatic scrotal hematoma, initial encounter     Past Surgical History:  Procedure Laterality Date  . CYSTOSCOPY  05/17/2016   Procedure: CYSTOSCOPY FLEXIBLE;  Surgeon: Vanna Scotland, MD;  Location: [facility] ORS;  Service: Urology;;  . HEMATOMA EVACUATION N/A 05/17/2016   Procedure: EVACUATION HEMATOMA- ;  Surgeon: Vanna Scotland, MD;  Location: [facility] ORS;  Service: Urology;  Laterality: N/A;  . SCROTAL EXPLORATION N/A 05/17/2016   Procedure: SCROTUM EXPLORATION;  Surgeon: Vanna Scotland, MD;  Location: [facility] ORS;  Service: Urology;  Laterality: N/A;    Prior to Admission medications   Not on File    Allergies Patient has no known allergies.  No family history on file.  Social History Social History   Tobacco Use  . Smoking status: Current Every Day Smoker  . Smokeless tobacco: Never Used  Substance Use Topics  . Alcohol use: Yes  . Drug use: No    Review of Systems Constitutional: No fever/chills Eyes: No visual  changes. ENT: Positive for sore throat.  Cardiovascular: Positive for chest pain. Respiratory: Denies shortness of breath. Gastrointestinal: Positive for abdominal pain, nausea and vomiting. Genitourinary: Negative for dysuria. Musculoskeletal: Negative for back pain. Skin: Negative for rash. Neurological: Negative for headaches, focal weakness or numbness.  ____________________________________________   PHYSICAL EXAM:  VITAL SIGNS: ED Triage Vitals [03/11/17 1146]  Enc Vitals Group     BP 125/71     Pulse Rate 76     Resp 18     Temp 97.8 F (36.6 C)     Temp Source Oral     SpO2 100 %     Weight 180 lb (81.6 kg)     Height 5\' 5"  (1.651 m)     Head Circumference      Peak Flow      Pain Score 7   Constitutional: Alert and oriented. Well appearing and in no distress. Eyes: Conjunctivae are normal.  ENT   Head: Normocephalic and atraumatic.   Nose: No congestion/rhinnorhea.   Mouth/Throat: Mucous membranes are moist.   Neck: No stridor. Hematological/Lymphatic/Immunilogical: No cervical lymphadenopathy. Cardiovascular: Normal rate, regular rhythm.  No murmurs, rubs, or gallops.  Respiratory: Normal respiratory effort without tachypnea nor retractions. Breath sounds are clear and equal bilaterally. No wheezes/rales/rhonchi. Gastrointestinal: Soft and non tender. No rebound. No guarding.  Genitourinary: Deferred Musculoskeletal: Normal range of motion in all extremities. No lower extremity edema. Neurologic:  Normal speech and language. No gross focal neurologic deficits are appreciated.  Skin:  Skin is warm, dry and intact. No rash noted. Psychiatric: Mood and affect are normal. Speech and behavior are normal. Patient exhibits appropriate insight and judgment.  ____________________________________________    LABS (pertinent positives/negatives)  Lipase 45 CBC wnl CMP wnl except glu  110  ____________________________________________   EKG  None  ____________________________________________    RADIOLOGY  None  ____________________________________________   PROCEDURES  Procedures  ____________________________________________   INITIAL IMPRESSION / ASSESSMENT AND PLAN / ED COURSE  Pertinent labs & imaging results that were available during my care of the patient were reviewed by me and considered in my medical decision making (see chart for details).  Patient presents to the emergency department today because of concerns for heartburn.  He has had symptoms for years.  Blood work without any concerning findings.  At this point I think gastritis with esophagitis very likely.  Will plan on starting patient on antiacid and sucralfate.  I discussed with patient importance of establishing care with primary care physician.  Discussed diet.  ____________________________________________   FINAL CLINICAL IMPRESSION(S) / ED DIAGNOSES  Final diagnoses:  Gastroesophageal reflux disease with esophagitis     Note: This dictation was prepared with Dragon dictation. Any transcriptional errors that result from this process are unintentional     Phineas SemenGoodman, Kaliyah Gladman, MD 03/11/17 1418

## 2017-03-29 ENCOUNTER — Encounter: Payer: Self-pay | Admitting: Emergency Medicine

## 2017-03-29 ENCOUNTER — Emergency Department
Admission: EM | Admit: 2017-03-29 | Discharge: 2017-03-29 | Disposition: A | Discharge status: Home / Self Care | Payer: Self-pay | Attending: Emergency Medicine | Admitting: Emergency Medicine

## 2017-03-29 DIAGNOSIS — R42 Dizziness and giddiness: Secondary | ICD-10-CM | POA: Insufficient documentation

## 2017-03-29 DIAGNOSIS — Z5321 Procedure and treatment not carried out due to patient leaving prior to being seen by health care provider: Secondary | ICD-10-CM | POA: Insufficient documentation

## 2017-03-29 LAB — CBC
HCT: 47.9 % (ref 40.0–52.0)
HEMOGLOBIN: 16.8 g/dL (ref 13.0–18.0)
MCH: 31.6 pg (ref 26.0–34.0)
MCHC: 34.9 g/dL (ref 32.0–36.0)
MCV: 90.5 fL (ref 80.0–100.0)
PLATELETS: 265 10*3/uL (ref 150–440)
RBC: 5.3 MIL/uL (ref 4.40–5.90)
RDW: 13 % (ref 11.5–14.5)
WBC: 5.6 10*3/uL (ref 3.8–10.6)

## 2017-03-29 LAB — URINALYSIS, COMPLETE (UACMP) WITH MICROSCOPIC
Bacteria, UA: NONE SEEN
Bilirubin Urine: NEGATIVE
GLUCOSE, UA: NEGATIVE mg/dL
HGB URINE DIPSTICK: NEGATIVE
Ketones, ur: NEGATIVE mg/dL
LEUKOCYTES UA: NEGATIVE
Nitrite: NEGATIVE
PROTEIN: NEGATIVE mg/dL
RBC / HPF: NONE SEEN RBC/hpf (ref 0–5)
Specific Gravity, Urine: 1.005 (ref 1.005–1.030)
Squamous Epithelial / LPF: NONE SEEN
pH: 7 (ref 5.0–8.0)

## 2017-03-29 LAB — BASIC METABOLIC PANEL
ANION GAP: 7 (ref 5–15)
BUN: 13 mg/dL (ref 6–20)
CALCIUM: 9.1 mg/dL (ref 8.9–10.3)
CHLORIDE: 100 mmol/L — AB (ref 101–111)
CO2: 25 mmol/L (ref 22–32)
CREATININE: 0.78 mg/dL (ref 0.61–1.24)
GFR calc non Af Amer: >60 mL/min (ref >60)
Glucose, Bld: 96 mg/dL (ref 65–99)
Potassium: 4.6 mmol/L (ref 3.5–5.1)
SODIUM: 132 mmol/L — AB (ref 135–145)

## 2017-03-29 LAB — GLUCOSE, CAPILLARY: GLUCOSE-CAPILLARY: 84 mg/dL (ref 65–99)

## 2017-03-29 NOTE — ED Triage Notes (Signed)
Pt comes into the ED via POV c/o lightheadedness and shaking.  Patient denies feeling as though he is going to pass out and denies being diabetic.  Patient in NAD at this time with even and unlabored respirations.  Patient states he has not eaten anything yet today.  Denies any chest pain, shortness of breath, or nausea.

## 2017-03-30 ENCOUNTER — Telehealth: Payer: Self-pay | Admitting: Emergency Medicine

## 2017-03-30 NOTE — Telephone Encounter (Signed)
Called patient due to lwot to inquire about condition and follow up plans. Both numbers are not in service. 

## 2017-05-04 ENCOUNTER — Emergency Department
Admission: EM | Admit: 2017-05-04 | Discharge: 2017-05-04 | Disposition: A | Discharge status: Home / Self Care | Payer: Self-pay | Attending: Emergency Medicine | Admitting: Emergency Medicine

## 2017-05-04 ENCOUNTER — Encounter: Payer: Self-pay | Admitting: Emergency Medicine

## 2017-05-04 DIAGNOSIS — H6123 Impacted cerumen, bilateral: Secondary | ICD-10-CM | POA: Insufficient documentation

## 2017-05-04 DIAGNOSIS — F17200 Nicotine dependence, unspecified, uncomplicated: Secondary | ICD-10-CM | POA: Insufficient documentation

## 2017-05-04 MED ORDER — DOCUSATE SODIUM 50 MG/5ML PO LIQD
50.0000 mg | Freq: Every day | ORAL | Status: DC
  Administered 2017-05-04: 50 mg via ORAL
  Filled 2017-05-04: qty 10

## 2017-05-04 NOTE — ED Notes (Signed)
Bilateral ears have been irrigated, patient feels relief in both.  Provider at bedside reassessing patient.

## 2017-05-04 NOTE — Discharge Instructions (Addendum)
Follow-up with your doctor at [facility] if any continued problems. Do not use any Q-tips in your ears.  You may obtain an over-the-counter earwax removal kit at any pharmacy.

## 2017-05-04 NOTE — ED Notes (Signed)
Patient lying on right side, will irrigate ear in 20 minutes.

## 2017-05-04 NOTE — ED Triage Notes (Signed)
Pt comes into the ED via POV c/o otalgia bilaterally.  Patient states he pulls chunks of earwax out but he still feels like they are clogged.  Patient in NAD at this time with even and unlabored respirations.

## 2017-05-04 NOTE — ED Notes (Signed)
See triage note  Presents with decreased hearing to both ears   Mainly the left ear  Hx of same

## 2017-05-04 NOTE — ED Provider Notes (Signed)
[facility] Emergency Department Provider Note  ____________________________________________   First MD Initiated Contact with Patient 05/04/17 1141     (approximate)  I have reviewed the triage vital signs and the nursing notes.   HISTORY  Chief Complaint Otalgia   HPI Rodney Leblanc is a 22 y.o. male is here with complaint of ear pain bilaterally.  Patient has been using Q-tips to clean his ears and states that he has a lot of wax.  Hearing has decreased.  He denies any fever, chills, nausea or vomiting.  He rates pain as 5 out of 10.   History reviewed. No pertinent past medical history.  Patient Active Problem List   Diagnosis Date Noted  . Traumatic scrotal hematoma 05/18/2016  . Traumatic scrotal hematoma, initial encounter     Past Surgical History:  Procedure Laterality Date  . CYSTOSCOPY  05/17/2016   Procedure: CYSTOSCOPY FLEXIBLE;  Surgeon: Hollice Espy, MD;  Location: [facility] ORS;  Service: Urology;;  . HEMATOMA EVACUATION N/A 05/17/2016   Procedure: EVACUATION HEMATOMA- 350ML;  Surgeon: Hollice Espy, MD;  Location: [facility] ORS;  Service: Urology;  Laterality: N/A;  . SCROTAL EXPLORATION N/A 05/17/2016   Procedure: SCROTUM EXPLORATION;  Surgeon: Hollice Espy, MD;  Location: [facility] ORS;  Service: Urology;  Laterality: N/A;    Prior to Admission medications   Medication Sig Start Date End Date Taking? Authorizing Provider  famotidine (PEPCID) 40 MG tablet Take 1 tablet (40 mg total) by mouth every evening. 03/11/17 03/11/18  Nance Pear, MD  sucralfate (CARAFATE) 1 g tablet Take 1 tablet (1 g total) by mouth 4 (four) times daily. 03/11/17   Nance Pear, MD    Allergies Patient has no known allergies.  No family history on file.  Social History Social History   Tobacco Use  . Smoking status: Current Every Day Smoker  . Smokeless tobacco: Never Used  Substance Use Topics  . Alcohol use: Not Currently    Frequency: Never  .  Drug use: Yes    Types: Marijuana    Review of Systems Constitutional: No fever/chills Eyes: No visual changes. ENT: Positive for bilateral ear pain. Cardiovascular: Denies chest pain. Respiratory: Denies shortness of breath. Gastrointestinal:   No nausea, no vomiting.   Genitourinary: Negative for dysuria. Skin: Negative for rash. ___________________________________________   PHYSICAL EXAM:  VITAL SIGNS: ED Triage Vitals  Enc Vitals Group     BP 05/04/17 1127 (!) 161/76     Pulse Rate 05/04/17 1127 86     Resp 05/04/17 1127 18     Temp 05/04/17 1127 (!) 97.5 F (36.4 C)     Temp Source 05/04/17 1127 Oral     SpO2 05/04/17 1127 100 %     Weight 05/04/17 1128 172 lb (78 kg)     Height 05/04/17 1128 5' 6"  (1.676 m)     Head Circumference --      Peak Flow --      Pain Score 05/04/17 1128 5     Pain Loc --      Pain Edu? --      Excl. in [place]? --    Constitutional: Alert and oriented. Well appearing and in no acute distress. Eyes: Conjunctivae are normal.  Head: Atraumatic. Nose: No congestion/rhinnorhea.  EACs are obstructed with cerumen.  After cerumen impaction TMs are visible with the left being more visible than the right.  No erythema or injection is noted. Mouth/Throat: Mucous membranes are moist.  Oropharynx non-erythematous. Neck:  No stridor.   Cardiovascular: Normal rate, regular rhythm. Grossly normal heart sounds.  Good peripheral circulation. Respiratory: Normal respiratory effort.  No retractions. Lungs CTAB. Musculoskeletal: Moves upper and lower extremities without any difficulty.   Neurologic:  Normal speech and language.  Psychiatric: Mood and affect are normal. Speech and behavior are normal.  ____________________________________________   LABS (all labs ordered are listed, but only abnormal results are displayed)  Labs Reviewed - No data to display  PROCEDURES  Procedure(s) performed: None  Procedures  Critical Care performed:  No  ____________________________________________   INITIAL IMPRESSION / ASSESSMENT AND PLAN / ED COURSE  Patient is here with cerumen impaction bilaterally.  These were lavaged after Colace was placed in both ears.  Patient was discharged and improved condition and hearing better.  He was advised not to use Q-tips in the future and to obtain a ear wash kit if any continued cerumen problems. ____________________________________________   FINAL CLINICAL IMPRESSION(S) / ED DIAGNOSES  Final diagnoses:  Bilateral impacted cerumen     ED Discharge Orders    None       Note:  This document was prepared using Dragon voice recognition software and may include unintentional dictation errors.    Johnn Hai, PA-C 05/04/17 1347    Earleen Newport, MD 05/04/17 [id number]

## 2018-05-20 ENCOUNTER — Other Ambulatory Visit: Payer: Self-pay

## 2018-05-20 ENCOUNTER — Encounter: Payer: Self-pay | Admitting: *Deleted

## 2018-05-20 DIAGNOSIS — Y906 Blood alcohol level of 120-199 mg/100 ml: Secondary | ICD-10-CM | POA: Insufficient documentation

## 2018-05-20 DIAGNOSIS — Z79899 Other long term (current) drug therapy: Secondary | ICD-10-CM | POA: Insufficient documentation

## 2018-05-20 DIAGNOSIS — F17200 Nicotine dependence, unspecified, uncomplicated: Secondary | ICD-10-CM | POA: Insufficient documentation

## 2018-05-20 DIAGNOSIS — F10929 Alcohol use, unspecified with intoxication, unspecified: Secondary | ICD-10-CM | POA: Insufficient documentation

## 2018-05-20 DIAGNOSIS — R45851 Suicidal ideations: Secondary | ICD-10-CM | POA: Insufficient documentation

## 2018-05-20 DIAGNOSIS — F1914 Other psychoactive substance abuse with psychoactive substance-induced mood disorder: Secondary | ICD-10-CM | POA: Insufficient documentation

## 2018-05-20 LAB — CBC
HCT: 50 % (ref 39.0–52.0)
Hemoglobin: 17.3 g/dL — ABNORMAL HIGH (ref 13.0–17.0)
MCH: 31.6 pg (ref 26.0–34.0)
MCHC: 34.6 g/dL (ref 30.0–36.0)
MCV: 91.2 fL (ref 80.0–100.0)
Platelets: 290 10*3/uL (ref 150–400)
RBC: 5.48 MIL/uL (ref 4.22–5.81)
RDW: 11.9 % (ref 11.5–15.5)
WBC: 10.5 10*3/uL (ref 4.0–10.5)
nRBC: 0 % (ref 0.0–0.2)

## 2018-05-20 LAB — COMPREHENSIVE METABOLIC PANEL
ALT: 43 U/L (ref 0–44)
AST: 33 U/L (ref 15–41)
Albumin: 5.3 g/dL — ABNORMAL HIGH (ref 3.5–5.0)
Alkaline Phosphatase: 69 U/L (ref 38–126)
Anion gap: 10 (ref 5–15)
BUN: 8 mg/dL (ref 6–20)
CO2: 27 mmol/L (ref 22–32)
Calcium: 9.4 mg/dL (ref 8.9–10.3)
Chloride: 106 mmol/L (ref 98–111)
Creatinine, Ser: 0.79 mg/dL (ref 0.61–1.24)
GFR calc Af Amer: >60 mL/min (ref >60)
GFR calc non Af Amer: >60 mL/min (ref >60)
Glucose, Bld: 99 mg/dL (ref 70–99)
Potassium: 4.1 mmol/L (ref 3.5–5.1)
Sodium: 143 mmol/L (ref 135–145)
Total Bilirubin: 0.7 mg/dL (ref 0.3–1.2)
Total Protein: 8.5 g/dL — ABNORMAL HIGH (ref 6.5–8.1)

## 2018-05-20 LAB — URINE DRUG SCREEN, QUALITATIVE (ARMC ONLY)
Amphetamines, Ur Screen: NOT DETECTED
Barbiturates, Ur Screen: NOT DETECTED
Benzodiazepine, Ur Scrn: NOT DETECTED
Cannabinoid 50 Ng, Ur ~~LOC~~: POSITIVE — AB
Cocaine Metabolite,Ur ~~LOC~~: NOT DETECTED
MDMA (Ecstasy)Ur Screen: NOT DETECTED
Methadone Scn, Ur: NOT DETECTED
Opiate, Ur Screen: NOT DETECTED
Phencyclidine (PCP) Ur S: NOT DETECTED
Tricyclic, Ur Screen: NOT DETECTED

## 2018-05-20 LAB — ETHANOL: Alcohol, Ethyl (B): 179 mg/dL — ABNORMAL HIGH (ref <10)

## 2018-05-20 NOTE — ED Triage Notes (Signed)
Pt brought in by [employer] dept handcuffed.  Pt is IVC.  Pt is intoxicated and states his sister killed herself 2 months ago, so he decided he would grieve for her this weekend by drinking.  Drug use today.  Pt calm and cooperative.  Pt denies SI or HI.

## 2018-05-21 ENCOUNTER — Emergency Department
Admission: EM | Admit: 2018-05-21 | Discharge: 2018-05-21 | Disposition: A | Discharge status: Home / Self Care | Payer: Self-pay | Attending: Emergency Medicine | Admitting: Emergency Medicine

## 2018-05-21 DIAGNOSIS — F39 Unspecified mood [affective] disorder: Secondary | ICD-10-CM | POA: Diagnosis present

## 2018-05-21 DIAGNOSIS — G4700 Insomnia, unspecified: Secondary | ICD-10-CM | POA: Insufficient documentation

## 2018-05-21 DIAGNOSIS — J309 Allergic rhinitis, unspecified: Secondary | ICD-10-CM | POA: Insufficient documentation

## 2018-05-21 DIAGNOSIS — T50901A Poisoning by unspecified drugs, medicaments and biological substances, accidental (unintentional), initial encounter: Secondary | ICD-10-CM | POA: Diagnosis present

## 2018-05-21 DIAGNOSIS — F909 Attention-deficit hyperactivity disorder, unspecified type: Secondary | ICD-10-CM | POA: Insufficient documentation

## 2018-05-21 DIAGNOSIS — F1994 Other psychoactive substance use, unspecified with psychoactive substance-induced mood disorder: Secondary | ICD-10-CM

## 2018-05-21 DIAGNOSIS — R002 Palpitations: Secondary | ICD-10-CM | POA: Insufficient documentation

## 2018-05-21 DIAGNOSIS — L309 Dermatitis, unspecified: Secondary | ICD-10-CM | POA: Insufficient documentation

## 2018-05-21 DIAGNOSIS — F1910 Other psychoactive substance abuse, uncomplicated: Secondary | ICD-10-CM

## 2018-05-21 DIAGNOSIS — F10929 Alcohol use, unspecified with intoxication, unspecified: Secondary | ICD-10-CM

## 2018-05-21 LAB — SALICYLATE LEVEL: Salicylate Lvl: <7 mg/dL (ref 2.8–30.0)

## 2018-05-21 LAB — ACETAMINOPHEN LEVEL: Acetaminophen (Tylenol), Serum: <10 ug/mL — ABNORMAL LOW (ref 10–30)

## 2018-05-21 NOTE — ED Notes (Addendum)
Dr. York Cerise at the bedside for pt evaluation. Pt resting on stretcher with lights off to enhance rest. Pt appears cooperative and calm. No distress noted at this time.

## 2018-05-21 NOTE — ED Notes (Signed)
Gave patient some apple juice, and a phone to make a call.

## 2018-05-21 NOTE — Discharge Instructions (Addendum)
You have been seen in the emergency department for a  psychiatric concern. You have been evaluated both medically as well as psychiatrically. Please follow-up with your outpatient resources provided. Return to the emergency department for any worsening symptoms, or any thoughts of hurting yourself or anyone else so that we may attempt to help you. 

## 2018-05-21 NOTE — ED Provider Notes (Signed)
-----------------------------------------   12:18 PM on 05/21/2018 -----------------------------------------  Patient has been seen and evaluated by psychiatry, they believe the patient safe for discharge home from a psychiatric standpoint.  Patient's medical work-up has been largely nonrevealing besides elevated ethanol level at the time of arrival last night.   Minna Antis, MD 05/21/18 1218

## 2018-05-21 NOTE — ED Notes (Signed)
Patient currently resting in bed.  No distress noted. Will continue to monitor.  

## 2018-05-21 NOTE — ED Notes (Signed)
Pt reports he had not had time to mourn his sisters death and he had been drinking and his mom called the police thinking he was trying to kill himself because he does not drink often. Pt denies any SI/HI. Cooperative and calm. Alert and oriented. Unlabored.

## 2018-05-21 NOTE — ED Notes (Signed)
Pt given breakfast tray

## 2018-05-21 NOTE — ED Notes (Signed)
Pt. Alert and oriented, warm and dry, in no distress. Pt. Denies SI, HI, and AVH. Pt. Encouraged to let nursing staff know of any concerns or needs. 

## 2018-05-21 NOTE — ED Notes (Signed)
Psych consult at bedside.

## 2018-05-21 NOTE — ED Notes (Signed)
Breakfast tray ordered 

## 2018-05-21 NOTE — ED Provider Notes (Signed)
[facility] Emergency Department Provider Note  ____________________________________________   First MD Initiated Contact with Patient 05/21/18 0103     (approximate)  I have reviewed the triage vital signs and the nursing notes.   HISTORY  Chief Complaint Behavior Problem  Level 5 caveat:  history/ROS limited by acute intoxication  HPI Rodney Leblanc is a 23 y.o. male with no known medical history except as listed below who presents under involuntary commitment for suicidal ideation and alcohol and drug use.  Reportedly he has bipolar disorder and has not been taking medications.  His sister died relatively recently and he has been depressed.  He supposedly drink alcohol, use marijuana, and possibly used heroin tonight.  His mother put him under involuntary commitment.  The patient is minimally responsive to me.  He will open his eyes and nod his head and shake his head to answer simple questions but will not speak to me.  He orients to touch and loud voice.  He is protecting his airway.  When I ask him if he wanted to kill himself he nodded his head in the affirmative.  He also nodded his head affirmatively when asked if he had been drinking alcohol and using marijuana but he shook his head in the negative when asked if he had used any other drugs.         No past medical history on file.  Patient Active Problem List   Diagnosis Date Noted  . Traumatic scrotal hematoma 05/18/2016  . Traumatic scrotal hematoma, initial encounter     Past Surgical History:  Procedure Laterality Date  . CYSTOSCOPY  05/17/2016   Procedure: CYSTOSCOPY FLEXIBLE;  Surgeon: Vanna Scotland, MD;  Location: [facility] ORS;  Service: Urology;;  . HEMATOMA EVACUATION N/A 05/17/2016   Procedure: EVACUATION HEMATOMA- ;  Surgeon: Vanna Scotland, MD;  Location: [facility] ORS;  Service: Urology;  Laterality: N/A;  . SCROTAL EXPLORATION N/A 05/17/2016   Procedure: SCROTUM EXPLORATION;   Surgeon: Vanna Scotland, MD;  Location: [facility] ORS;  Service: Urology;  Laterality: N/A;    Prior to Admission medications   Medication Sig Start Date End Date Taking? Authorizing Provider  famotidine (PEPCID) 40 MG tablet Take 1 tablet (40 mg total) by mouth every evening. 03/11/17 03/11/18  Phineas Semen, MD  sucralfate (CARAFATE) 1 g tablet Take 1 tablet (1 g total) by mouth 4 (four) times daily. 03/11/17   Phineas Semen, MD    Allergies Patient has no known allergies.  No family history on file.  Social History Social History   Tobacco Use  . Smoking status: Current Every Day Smoker  . Smokeless tobacco: Never Used  Substance Use Topics  . Alcohol use: Yes    Frequency: Never  . Drug use: Yes    Types: Marijuana    Review of Systems Level 5 caveat:  history/ROS limited by acute intoxication ____________________________________________   PHYSICAL EXAM:  VITAL SIGNS: ED Triage Vitals  Enc Vitals Group     BP 05/20/18 2310 (!) 154/92     Pulse Rate 05/20/18 2310 94     Resp 05/20/18 2310 20     Temp 05/20/18 2310 98.4 F (36.9 C)     Temp Source 05/20/18 2310 Oral     SpO2 05/20/18 2310 100 %     Weight 05/20/18 2308 77.1 kg (170 lb)     Height 05/20/18 2308 1.676 m (5\' 6" )     Head Circumference --      Peak  Flow --      Pain Score 05/20/18 2308 0     Pain Loc --      Pain Edu? --      Excl. in [place]? --     Constitutional: Somnolent, appears clinically consistent with intoxication.  Awakens to loud voice and light touch and then falls back asleep. Eyes: Conjunctivae are normal.  Pupils are small and sluggish bilaterally. Head: Atraumatic. Nose: No congestion/rhinnorhea. Mouth/Throat: Mucous membranes are moist. Neck: No stridor.  No meningeal signs.   Cardiovascular: Normal rate, regular rhythm. Good peripheral circulation. Grossly normal heart sounds. Respiratory: Normal respiratory effort.  No retractions. No audible wheezing. Gastrointestinal: Soft and  nontender. No distention.  Musculoskeletal: No lower extremity tenderness nor edema. No gross deformities of extremities. Neurologic: Moving all 4 extremities and protecting his airway but is unable to participate in a neurological exam. Skin:  Skin is warm, dry and intact. No rash noted.  ____________________________________________   LABS (all labs ordered are listed, but only abnormal results are displayed)  Labs Reviewed  COMPREHENSIVE METABOLIC PANEL - Abnormal; Notable for the following components:      Result Value   Total Protein 8.5 (*)    Albumin 5.3 (*)    All other components within normal limits  ETHANOL - Abnormal; Notable for the following components:   Alcohol, Ethyl (B) 179 (*)    All other components within normal limits  CBC - Abnormal; Notable for the following components:   Hemoglobin 17.3 (*)    All other components within normal limits  URINE DRUG SCREEN, QUALITATIVE ([facility] ONLY) - Abnormal; Notable for the following components:   Cannabinoid 50 Ng, [place] POSITIVE (*)    All other components within normal limits  ACETAMINOPHEN LEVEL - Abnormal; Notable for the following components:   Acetaminophen (Tylenol), Serum <10 (*)    All other components within normal limits  SALICYLATE LEVEL   ____________________________________________  EKG  None - EKG not ordered by ED physician ____________________________________________  RADIOLOGY   ED MD interpretation: No indication for imaging  Official radiology report(s): No results found.  ____________________________________________   PROCEDURES   Procedure(s) performed (including Critical Care):  Procedures   ____________________________________________   INITIAL IMPRESSION / MDM / ASSESSMENT AND PLAN / ED COURSE  As part of my medical decision making, I reviewed the following data within the electronic MEDICAL RECORD NUMBER Nursing notes reviewed and incorporated, Labs reviewed , Old chart reviewed,  A consult was requested from this/these consultant(s) Psychiatry, Notes from prior ED visits and [place] Controlled Substance Database      *Rodney Leblanc was evaluated in Emergency Department on 05/21/2018 for the symptoms described in the history of present illness. He was evaluated in the context of the global COVID-19 pandemic, which necessitated consideration that the patient might be at risk for infection with the SARS-CoV-2 virus that causes COVID-19. Institutional protocols and algorithms that pertain to the evaluation of patients at risk for COVID-19 are in a state of rapid change based on information released by regulatory bodies including the CDC and federal and state organizations. These policies and algorithms were followed during the patient's care in the ED.*  Differential diagnosis includes, but is not limited to, depression, suicidal ideation, substance-induced mood disorder, alcohol intoxication, narcotics use.  The patient's vital signs are stable and he is protecting his airway, no indication for emergent intervention at this time.  I did order lab work as per psychiatric protocol but I anticipate he will  probably be feeling and acting better once he is sober in the morning.  There is no point in ordering a psychiatry consult at this time because he is unable or unwilling to participate.  Anticipate we will order a psych consult in the morning.  His alcohol level is 179, comprehensive metabolic panel is essentially normal, and urine drug screen is positive only for cannabinoids but it typically does not show heroin.  CBC is normal.  Salicylate and acetaminophen levels are pending.  Clinical Course as of May 20 649  Tue May 21, 2018  0246 Salicylate Lvl: <7.0 [CF]  0246 Acetaminophen (Tylenol), S(!): <10 [CF]  0600 Patient is awake enough now to be able to participate in a psych evaluation. Ordering consult.   [CF]    Clinical Course User Index [CF] Loleta Rose, MD      ____________________________________________  FINAL CLINICAL IMPRESSION(S) / ED DIAGNOSES  Final diagnoses:  Alcoholic intoxication with complication (HCC)  Substance abuse (HCC)     MEDICATIONS GIVEN DURING THIS VISIT:  Medications - No data to display   ED Discharge Orders    None       Note:  This document was prepared using Dragon voice recognition software and may include unintentional dictation errors.   Loleta Rose, MD 05/21/18 [id number]

## 2018-05-21 NOTE — ED Notes (Signed)
IVC has been rescinded.  Pt alert and oriented.  Pt has called for a ride home.

## 2018-05-21 NOTE — BH Assessment (Signed)
Assessment Note  Rodney Leblanc is an 23 y.o. male who presents to the ER via law enforcement due to mother having concerns about his mental and emotional state. Patient was intoxicated and mother was concerned he was going to harm himself. Upon arrival to the ER his BAC was 179.  Patient's sister ended her life this year, March 1st, while intoxicated.  Patient states, he was with family and yesterday (05/20/2018) when he started to mourn the loss of his sister for the first time. He asked his family to leave him alone but they continued to try to console him, alone with the neighbors and it only agitated him. He states, "When I told them to leave me alone, they acted like they didn't listen. So when I started cursing them out and telling them to leave me the fuck alone, they thought I was crazy. When they should've left me alone." Patient further reports, since the passing of his sister, the family is more "affectioned" and prior to that he was the "black sheep." He states he enjoys the attention but it's too much.  During the interview, the patient was calm, cooperative and pleasant. He was able to provide appropriate answers to the questions. Throughout the interview, he denied SI/HI and AV/H. He was also able to provide protective factors and reasons why he wanted to live. Patient admits to the use of alcohol and cannabis. He states, he smokes THC three to four times a week. He states he doesn't' drink alcohol that much. Thus, yesterday (05/20/2018) when he was with family and intoxicated, they weren't use to his behaviors.  Per the report of the patient's mother (Chantel Barnwell-[phone]), she only had concerns when he drink alcohol because he doesn't drink that often and his sister ended her life while intoxicated. "When I spoke with him this morning (05/21/2018), he sounds like he was in his right head." She further reports, she is aware the patient's smoke THC and he does that to  self-medicated. She states she's not in agreement with the drug use but the cannabis have the same positive effect as his medications.   Diagnosis: Substance Induced Mood D/O  Past Medical History: No past medical history on file.  Past Surgical History:  Procedure Laterality Date  . CYSTOSCOPY  05/17/2016   Procedure: CYSTOSCOPY FLEXIBLE;  Surgeon: Vanna ScotlandBrandon, Ashley, MD;  Location: [facility] ORS;  Service: Urology;;  . HEMATOMA EVACUATION N/A 05/17/2016   Procedure: EVACUATION HEMATOMA- 350ML;  Surgeon: Vanna ScotlandBrandon, Ashley, MD;  Location: [facility] ORS;  Service: Urology;  Laterality: N/A;  . SCROTAL EXPLORATION N/A 05/17/2016   Procedure: SCROTUM EXPLORATION;  Surgeon: Vanna ScotlandBrandon, Ashley, MD;  Location: [facility] ORS;  Service: Urology;  Laterality: N/A;    Family History: No family history on file.  Social History:  reports that he has been smoking. He has never used smokeless tobacco. He reports current alcohol use. He reports current drug use. Drug: Marijuana.  Additional Social History:  Alcohol / Drug Use Pain Medications: See PTA Prescriptions: See PTA Over the Counter: See PTA History of alcohol / drug use?: Yes Longest period of sobriety (when/how long): Unable to quantify Substance #1 Name of Substance 1: Alcohol Substance #2 Name of Substance 2: Cannabis  CIWA: CIWA-Ar BP: (!) 150/85 Pulse Rate: 94 COWS:    Allergies: No Known Allergies  Home Medications: (Not in a hospital admission)   OB/GYN Status:  No LMP for male patient.  General Assessment Data Location of Assessment[facility] ED TTS Assessment: In  system Is this a Tele or Face-to-Face Assessment?: Face-to-Face Is this an Initial Assessment or a Re-assessment for this encounter?: Initial Assessment Language Other than English: No Living Arrangements: Other (Comment)(Private Home) What gender do you identify as?: Male Marital status: Long term relationship Pregnancy Status: No Living Arrangements: Non-relatives/Friends Can pt  return to current living arrangement?: Yes Admission Status: Involuntary Petitioner: Family member Is patient capable of signing voluntary admission?: No(Under IVC) Referral Source: Other Insurance type: Reports of none  Medical Screening Exam [facility] Walk-in ONLY) Medical Exam completed: Yes  Crisis Care Plan Living Arrangements: Non-relatives/Friends Name of Psychiatrist: Reports of none Name of Therapist: Reports of none  Education Status Is patient currently in school?: No Is the patient employed, unemployed or receiving disability?: Employed  Risk to self with the past 6 months Suicidal Ideation: No Has patient been a risk to self within the past 6 months prior to admission? : No Suicidal Intent: No Has patient had any suicidal intent within the past 6 months prior to admission? : No Is patient at risk for suicide?: No Suicidal Plan?: No Has patient had any suicidal plan within the past 6 months prior to admission? : No Access to Means: No What has been your use of drugs/alcohol within the last 12 months?: Alcohol & Cannabis Previous Attempts/Gestures: No How many times?: 0 Other Self Harm Risks: Reports of none Triggers for Past Attempts: None known Intentional Self Injurious Behavior: None Family Suicide History: No Recent stressful life event(s): Other (Comment), Loss (Comment) Persecutory voices/beliefs?: No Depression: Yes Depression Symptoms: Isolating Substance abuse history and/or treatment for substance abuse?: Yes Suicide prevention information given to non-admitted patients: Not applicable  Risk to Others within the past 6 months Homicidal Ideation: No Does patient have any lifetime risk of violence toward others beyond the six months prior to admission? : No Thoughts of Harm to Others: No Current Homicidal Intent: No Current Homicidal Plan: No Access to Homicidal Means: No Identified Victim: Reports of none History of harm to others?: No Assessment of  Violence: None Noted Violent Behavior Description: Reports of none Does patient have access to weapons?: No Criminal Charges Pending?: No Does patient have a court date: No Is patient on probation?: No  Psychosis Hallucinations: None noted Delusions: None noted  Mental Status Report Appearance/Hygiene: Unremarkable, In scrubs Eye Contact: Good Motor Activity: Freedom of movement, Unremarkable Speech: Logical/coherent, Unremarkable Level of Consciousness: Alert Mood: Pleasant Affect: Appropriate to circumstance Anxiety Level: None Thought Processes: Coherent, Relevant Judgement: Unimpaired Orientation: Person, Place, Time, Situation, Appropriate for developmental age Obsessive Compulsive Thoughts/Behaviors: None  Cognitive Functioning Concentration: Normal Memory: Recent Intact, Remote Intact Is patient IDD: No Insight: Fair Impulse Control: Good Appetite: Fair Have you had any weight changes? : No Change Sleep: No Change Total Hours of Sleep: 8 Vegetative Symptoms: None  ADLScreening [facility] Assessment Services) Patient's cognitive ability adequate to safely complete daily activities?: Yes Patient able to express need for assistance with ADLs?: Yes Independently performs ADLs?: Yes (appropriate for developmental age)  Prior Inpatient Therapy Prior Inpatient Therapy: No  Prior Outpatient Therapy Prior Outpatient Therapy: No Does patient have an ACCT team?: No Does patient have Intensive In-House Services?  : No Does patient have [facility] services? : No Does patient have P4CC services?: No  ADL Screening (condition at time of admission) Patient's cognitive ability adequate to safely complete daily activities?: Yes Is the patient deaf or have difficulty hearing?: No Does the patient have difficulty seeing, even when wearing glasses/contacts?: No Does  the patient have difficulty concentrating, remembering, or making decisions?: No Patient able to express need for  assistance with ADLs?: Yes Does the patient have difficulty dressing or bathing?: No Independently performs ADLs?: Yes (appropriate for developmental age) Does the patient have difficulty walking or climbing stairs?: No Weakness of Legs: None Weakness of Arms/Hands: None  Home Assistive Devices/Equipment Home Assistive Devices/Equipment: None  Therapy Consults (therapy consults require a physician order) PT Evaluation Needed: No OT Evalulation Needed: No SLP Evaluation Needed: No Abuse/Neglect Assessment (Assessment to be complete while patient is alone) Abuse/Neglect Assessment Can Be Completed: Yes Physical Abuse: Denies Verbal Abuse: Denies Sexual Abuse: Denies Exploitation of patient/patient's resources: Denies Self-Neglect: Denies Values / Beliefs Cultural Requests During Hospitalization: None Spiritual Requests During Hospitalization: None Consults Spiritual Care Consult Needed: No Social Work Consult Needed: No Merchant navy officer (For Healthcare) Does Patient Have a Medical Advance Directive?: No       Child/Adolescent Assessment Running Away Risk: Denies(Patient is an adult)  Disposition:  Disposition Initial Assessment Completed for this Encounter: Yes   Per request of Psych MD Viviano Simas), writer provided the pt. with information and instructions on how to access  Outpatient Mental Health & Substance Abuse Treatment.)   Patient denies SI/HI and AV/H.  [facility] [address]  [place], [place] [address] [phone] Healthcare [address],  [place], [place] [address] [phone]  [facility] M.J., Greif Counselor [address] [place], [address] [phone]   On Site Evaluation by:   Reviewed with Physician:    Lilyan Gilford MS, LCAS, [facility], NCC, CCSI Therapeutic Triage Specialist 05/21/2018 12:11 PM

## 2018-05-21 NOTE — Consult Note (Signed)
[facility] Face-to-Face Psychiatry Consult   Reason for Consult:  Polysubstance abuse Referring Physician:  Dr. Kerman Passey Patient Identification: Rodney Leblanc MRN:  [id number] Principal Diagnosis: Polysubstance overdose Diagnosis:  Principal Problem:   Polysubstance overdose Active Problems:   Episodic mood disorder [facility])  Patient seen, chart reviewed, collateral obtained from mother.  Total Time spent with patient: 1 hour  Subjective:  "I just got drunk and this was the first time I could cry about my sister."  HPI: Rodney Leblanc is a 23 y.o. male patient  with no known medical history except as listed below who presents under involuntary commitment for suicidal ideation and alcohol and drug use.  Reportedly he has bipolar disorder and has not been taking medications.  His sister died relatively recently and he has been depressed.  He supposedly drink alcohol, use marijuana, and possibly used heroin tonight.  His mother put him under involuntary commitment.  The patient is minimally responsive to me.  He will open his eyes and nod his head and shake his head to answer simple questions but will not speak to me.  He orients to touch and loud voice.  He is protecting his airway.  When I ask him if he wanted to kill himself he nodded his head in the affirmative.  He also nodded his head affirmatively when asked if he had been drinking alcohol and using marijuana but he shook his head in the negative when asked if he had used any other drugs.  On evaluation patient is calm and cooperative.  He is alert and oriented x4.  Patient endorses that he has not had time to grieve the loss of his sister who died by suicide on 01-Apr-2018 due to [employer] and his work schedule.  He reports that last night he was finally able to sit with his sister's wife, and they drink a case of beer.  States that he was crying, and everybody wanted to talk to him about it and he became agitated.  At some point  mother was contacted who expressed concern that patient might also kill himself, and placed him under involuntary commitment.  Patient reports he came willingly to the emergency department.  He states that he is supposed to work this afternoon, and is not want to lose his job.  He states he has been working regularly, as well as taking care of family commitments.  Patient specifically denies any past history of self-harm or suicide attempts.  Patient denies any past psychiatric history other than what is listed below from when he was a late adolescent/young adult.  Patient has not continued on medications.  Patient is currently denying suicidal ideation, plan, or intent.  He denies homicidal ideation.  He denies any auditory or visual hallucinations.  He denies regular use of alcohol, and has never had withdrawal symptoms.  Patient is agreeable to follow-up with therapy.  He is encouraged to speak with his mother, which he completes while this Probation officer is present.  Mother states that she was worried, but reports that he does sound back to his normal self.  Patient's mother is agreeable to picking up patient from the hospital.  Patient and mother are able to contract for safety.  All questions answered.  Please see assessment note for additional information from TTS who was present at time of interview as well as obtaining collateral from mother.  Past Psychiatric History: ADHD; ODD; Episodic mood disorder, r/o Bipolar vs. Substance use disorder. 2015-  Used to take Depakote for ODD and took Abilify - made lip twitch.  Risk to Self:  denies Risk to Others:  no Prior Inpatient Therapy:  no Prior Outpatient Therapy:  no  Past Medical History: No past medical history on file.  Past Surgical History:  Procedure Laterality Date  . CYSTOSCOPY  05/17/2016   Procedure: CYSTOSCOPY FLEXIBLE;  Surgeon: Hollice Espy, MD;  Location: [facility] ORS;  Service: Urology;;  . HEMATOMA EVACUATION N/A 05/17/2016   Procedure:  EVACUATION HEMATOMA- 350ML;  Surgeon: Hollice Espy, MD;  Location: [facility] ORS;  Service: Urology;  Laterality: N/A;  . SCROTAL EXPLORATION N/A 05/17/2016   Procedure: SCROTUM EXPLORATION;  Surgeon: Hollice Espy, MD;  Location: [facility] ORS;  Service: Urology;  Laterality: N/A;   Family History: No family history on file.   Family Psychiatric History: Sister - depression with suicide 24-Mar-2018 Dad- schizophrenia, in prison   Social History:  Social History   Substance and Sexual Activity  Alcohol Use Yes  . Frequency: Never     Social History   Substance and Sexual Activity  Drug Use Yes  . Types: Marijuana    Social History   Socioeconomic History  . Marital status: Single    Spouse name: Not on file  . Number of children: Not on file  . Years of education: Not on file  . Highest education level: Not on file  Occupational History  . Not on file  Social Needs  . Financial resource strain: Not on file  . Food insecurity:    Worry: Not on file    Inability: Not on file  . Transportation needs:    Medical: Not on file    Non-medical: Not on file  Tobacco Use  . Smoking status: Current Every Day Smoker  . Smokeless tobacco: Never Used  Substance and Sexual Activity  . Alcohol use: Yes    Frequency: Never  . Drug use: Yes    Types: Marijuana  . Sexual activity: Not on file  Lifestyle  . Physical activity:    Days per week: Not on file    Minutes per session: Not on file  . Stress: Not on file  Relationships  . Social connections:    Talks on phone: Not on file    Gets together: Not on file    Attends religious service: Not on file    Active member of club or organization: Not on file    Attends meetings of clubs or organizations: Not on file    Relationship status: Not on file  Other Topics Concern  . Not on file  Social History Narrative  . Not on file   Additional Social History:   Graduated HS from [employer] Working at [employer] 11 hours a day,  6 days a week. Had been living with sister, and sister's wife, Crystal up until sister's death, then stayed with his girlfriend and a friend off and on.   Sister died 2022-03-24--- suicide by hanging Is "needing time apart" from girlfriend also struggling with the loss of her brother who died in 2018/03/20 in a MVA.  Has close relationship with his family and meet with them every Sunday. 70 year old sister lives with mom. Has 5 brothers, never met.   Binge drinks 1 case of beer. Uses marijuana 4-5/week at night. 2 months no soda Tobacco 1/2 ppd   Allergies:  No Known Allergies  Labs:  Results for orders placed or performed during the hospital encounter  of 05/21/18 (from the past 48 hour(s))  Comprehensive metabolic panel     Status: Abnormal   Collection Time: 05/20/18 11:11 PM  Result Value Ref Range   Sodium 143 135 - 145 mmol/L   Potassium 4.1 3.5 - 5.1 mmol/L   Chloride 106 98 - 111 mmol/L   CO2 27 22 - 32 mmol/L   Glucose, Bld 99 70 - 99 mg/dL   BUN 8 6 - 20 mg/dL   Creatinine, Ser 0.79 0.61 - 1.24 mg/dL   Calcium 9.4 8.9 - 10.3 mg/dL   Total Protein 8.5 (H) 6.5 - 8.1 g/dL   Albumin 5.3 (H) 3.5 - 5.0 g/dL   AST 33 15 - 41 U/L   ALT 43 0 - 44 U/L   Alkaline Phosphatase 69 38 - 126 U/L   Total Bilirubin 0.7 0.3 - 1.2 mg/dL   GFR calc non Af Amer >60 >60 mL/min   GFR calc Af Amer >60 >60 mL/min   Anion gap 10 5 - 15    Comment: Performed at [facility], [address] [place], [place] [address]  Ethanol     Status: Abnormal   Collection Time: 05/20/18 11:11 PM  Result Value Ref Range   Alcohol, Ethyl (B) 179 (H) <10 mg/dL    Comment: (NOTE) Lowest detectable limit for serum alcohol is 10 mg/dL. For medical purposes only. Performed at [facility], [address] [place], [place] [address]   cbc     Status: Abnormal   Collection Time: 05/20/18 11:11 PM  Result Value Ref Range   WBC 10.5 4.0 - 10.5 K/uL   RBC 5.48 4.22 - 5.81 MIL/uL    Hemoglobin 17.3 (H) 13.0 - 17.0 g/dL   HCT 50.0 39.0 - 52.0 %   MCV 91.2 80.0 - 100.0 fL   MCH 31.6 26.0 - 34.0 pg   MCHC 34.6 30.0 - 36.0 g/dL   RDW 11.9 11.5 - 15.5 %   Platelets 290 150 - 400 K/uL   nRBC 0.0 0.0 - 0.2 %    Comment: Performed at [facility], [address], [place], [place] [address]  Urine Drug Screen, Qualitative     Status: Abnormal   Collection Time: 05/20/18 11:11 PM  Result Value Ref Range   Tricyclic, Ur Screen NONE DETECTED NONE DETECTED   Amphetamines, Ur Screen NONE DETECTED NONE DETECTED   MDMA (Ecstasy)Ur Screen NONE DETECTED NONE DETECTED   Cocaine Metabolite,Ur [place] NONE DETECTED NONE DETECTED   Opiate, Ur Screen NONE DETECTED NONE DETECTED   Phencyclidine (PCP) Ur S NONE DETECTED NONE DETECTED   Cannabinoid 50 Ng, Ur [place] POSITIVE (A) NONE DETECTED   Barbiturates, Ur Screen NONE DETECTED NONE DETECTED   Benzodiazepine, Ur Scrn NONE DETECTED NONE DETECTED   Methadone Scn, Ur NONE DETECTED NONE DETECTED    Comment: (NOTE) Tricyclics + metabolites, urine    Cutoff 1000 ng/mL Amphetamines + metabolites, urine  Cutoff 1000 ng/mL MDMA (Ecstasy), urine              Cutoff 500 ng/mL Cocaine Metabolite, urine          Cutoff 300 ng/mL Opiate + metabolites, urine        Cutoff 300 ng/mL Phencyclidine (PCP), urine         Cutoff 25 ng/mL Cannabinoid, urine                 Cutoff 50 ng/mL Barbiturates + metabolites, urine  Cutoff 200 ng/mL Benzodiazepine,  urine              Cutoff 200 ng/mL Methadone, urine                   Cutoff 300 ng/mL The urine drug screen provides only a preliminary, unconfirmed analytical test result and should not be used for non-medical purposes. Clinical consideration and professional judgment should be applied to any positive drug screen result due to possible interfering substances. A more specific alternate chemical method must be used in order to obtain a confirmed analytical result. Gas chromatography / mass  spectrometry (GC/MS) is the preferred confirmat ory method. Performed at [facility], [address] [place], [place] [address]   Salicylate level     Status: None   Collection Time: 05/20/18 11:11 PM  Result Value Ref Range   Salicylate Lvl <9.9 2.8 - 30.0 mg/dL    Comment: Performed at [facility], [address] [place], [place] [address]  Acetaminophen level     Status: Abnormal   Collection Time: 05/20/18 11:11 PM  Result Value Ref Range   Acetaminophen (Tylenol), Serum <10 (L) 10 - 30 ug/mL    Comment: (NOTE) Therapeutic concentrations vary significantly. A range of 10-30 ug/mL  may be an effective concentration for many patients. However, some  are best treated at concentrations outside of this range. Acetaminophen concentrations >150 ug/mL at 4 hours after ingestion  and >50 ug/mL at 12 hours after ingestion are often associated with  toxic reactions. Performed at [facility], [address] [place], [place] [address]     No current facility-administered medications for this encounter.    Current Outpatient Medications  Medication Sig Dispense Refill  . famotidine (PEPCID) 40 MG tablet Take 1 tablet (40 mg total) by mouth every evening. 30 tablet 1  . sucralfate (CARAFATE) 1 g tablet Take 1 tablet (1 g total) by mouth 4 (four) times daily. 60 tablet 0    Musculoskeletal: Strength & Muscle Tone: within normal limits Gait & Station: normal Patient leans: N/A  Psychiatric Specialty Exam: Physical Exam  Nursing note and vitals reviewed. Constitutional: He is oriented to person, place, and time. He appears well-developed and well-nourished. No distress.  HENT:  Head: Normocephalic and atraumatic.  Eyes: EOM are normal.  Neck: Normal range of motion.  Cardiovascular: Normal rate and regular rhythm.  Respiratory: Effort normal. No respiratory distress.  Musculoskeletal: Normal range of motion.  Neurological: He is alert and  oriented to person, place, and time.    Review of Systems  Constitutional: Negative.   HENT: Negative.   Respiratory: Negative.   Cardiovascular: Negative.   Gastrointestinal: Positive for heartburn.  Musculoskeletal: Negative.   Neurological: Negative.   Psychiatric/Behavioral: Positive for substance abuse. Negative for depression, hallucinations, memory loss and suicidal ideas. The patient is not nervous/anxious and does not have insomnia.     Blood pressure (!) 150/85, pulse 94, temperature 98.4 F (36.9 C), temperature source Oral, resp. rate 18, height 5' 6"  (1.676 m), weight 77.1 kg, SpO2 97 %.Body mass index is 27.44 kg/m.  General Appearance: Neat  Eye Contact:  Good  Speech:  Clear and Coherent and Normal Rate  Volume:  Normal  Mood:  Euthymic  Affect:  Congruent and Full Range  Thought Process:  Linear and Descriptions of Associations: Intact  Orientation:  Full (Time, Place, and Person)  Thought Content:  Logical and Hallucinations: None  Suicidal Thoughts:  No  Homicidal Thoughts:  No  Memory:  poor  Judgement:  Fair  Insight:  Shallow  Psychomotor Activity:  Normal  Concentration:  Concentration: Good and Attention Span: Good  Recall:  Good  Fund of Knowledge:  Good  Language:  Good  Akathisia:  No  Handed:  Right  AIMS (if indicated):     Assets:  Communication Skills Desire for Improvement Financial Resources/Insurance Housing Social Support Vocational/Educational  ADL's:  Intact  Cognition:  WNL  Sleep:    "ok"    Treatment Plan Summary: Plan Rescind involutary committment  Disposition: No evidence of imminent risk to self or others at present.   Patient does not meet criteria for psychiatric inpatient admission. Supportive therapy provided about ongoing stressors. Discussed crisis plan, support from social network, calling 911, coming to the Emergency Department, and calling Suicide Hotline. Provided with resources for outpatient therpay and  grief counseling.   He was able to engage in safety planning including plan to return to nearest emergency department or contact emergency services if he feels unable to maintain his own safety or the safety of others. Patient had no further questions, comments, or concerns. Discharge into care of family member, who agrees to maintain patient safety.     Lavella Hammock, MD 05/21/2018 9:59 AM

## 2019-04-16 ENCOUNTER — Ambulatory Visit: Payer: Self-pay

## 2019-04-16 NOTE — Telephone Encounter (Signed)
Pt. Stepped on a rusty screw yesterday. Right foot is swollen and bottom of foot is yellow per pt. Instructed to be seen today at [facility]. Verbalizes understanding.  Reason for Disposition . [1] Looks infected (spreading redness, pus) AND [2] large red area (> 2 in. or 5 cm)  Answer Assessment - Initial Assessment Questions 1. ONSET: "When did the pain start?"      Yesterday 2. LOCATION: "Where is the pain located?"      Bottom of foot 3. PAIN: "How bad is the pain?"    (Scale 1-10; or mild, moderate, severe)   -  MILD (1-3): doesn't interfere with normal activities    -  MODERATE (4-7): interferes with normal activities (e.g., work or school) or awakens from sleep, limping    -  SEVERE (8-10): excruciating pain, unable to do any normal activities, unable to walk     Moderate 4. WORK OR EXERCISE: "Has there been any recent work or exercise that involved this part of the body?"      No 5. CAUSE: "What do you think is causing the foot pain?"     Stepped on a rusty screw yesterday. 6. OTHER SYMPTOMS: "Do you have any other symptoms?" (e.g., leg pain, rash, fever, numbness)     Swelling, bottom of foot is yellow. 7. PREGNANCY: "Is there any chance you are pregnant?" "When was your last menstrual period?"     n/a  Protocols used: FOOT PAIN-A-AH

## 2019-05-09 ENCOUNTER — Emergency Department
Admission: EM | Admit: 2019-05-09 | Discharge: 2019-05-09 | Disposition: A | Discharge status: Home / Self Care | Payer: 59 | Attending: Emergency Medicine | Admitting: Emergency Medicine

## 2019-05-09 ENCOUNTER — Encounter: Payer: Self-pay | Admitting: Emergency Medicine

## 2019-05-09 ENCOUNTER — Other Ambulatory Visit: Payer: Self-pay

## 2019-05-09 DIAGNOSIS — S61012A Laceration without foreign body of left thumb without damage to nail, initial encounter: Secondary | ICD-10-CM | POA: Diagnosis present

## 2019-05-09 DIAGNOSIS — F17210 Nicotine dependence, cigarettes, uncomplicated: Secondary | ICD-10-CM | POA: Insufficient documentation

## 2019-05-09 DIAGNOSIS — Y999 Unspecified external cause status: Secondary | ICD-10-CM | POA: Diagnosis not present

## 2019-05-09 DIAGNOSIS — Z23 Encounter for immunization: Secondary | ICD-10-CM | POA: Insufficient documentation

## 2019-05-09 DIAGNOSIS — Y93G1 Activity, food preparation and clean up: Secondary | ICD-10-CM | POA: Insufficient documentation

## 2019-05-09 DIAGNOSIS — Y92511 Restaurant or cafe as the place of occurrence of the external cause: Secondary | ICD-10-CM | POA: Diagnosis not present

## 2019-05-09 DIAGNOSIS — W260XXA Contact with knife, initial encounter: Secondary | ICD-10-CM | POA: Insufficient documentation

## 2019-05-09 MED ORDER — CEPHALEXIN 500 MG PO CAPS: 500.0000 mg | ORAL_CAPSULE | Freq: Three times a day (TID) | ORAL | 0 refills | Status: AC

## 2019-05-09 MED ORDER — TETANUS-DIPHTH-ACELL PERTUSSIS 5-2.5-18.5 LF-MCG/0.5 IM SUSP
0.5000 mL | Freq: Once | INTRAMUSCULAR | Status: AC
  Administered 2019-05-09: 16:00:00 0.5 mL via INTRAMUSCULAR
  Filled 2019-05-09: qty 0.5

## 2019-05-09 NOTE — ED Provider Notes (Signed)
Emergency Department Provider Note  ____________________________________________  Time seen: Approximately 6:37 PM  I have reviewed the triage vital signs and the nursing notes.   HISTORY  Chief Complaint Laceration   Historian Patient     HPI Rodney Leblanc is a 24 y.o. male presents to the ED with a 1 cm left thumb laceration.  Patient sustained laceration accidentally with a knife while cutting vegetables at [employer].  No numbness or tingling of the left thumb.  No other alleviating measures have been attempted.   History reviewed. No pertinent past medical history.   Immunizations up to date:  Yes.     History reviewed. No pertinent past medical history.  Patient Active Problem List   Diagnosis Date Noted  . Attention deficit hyperactivity disorder 05/21/2018  . Allergic rhinitis 05/21/2018  . Awareness of heartbeats 05/21/2018  . Cannot sleep 05/21/2018  . 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 Episodic mood disorder ([place]) 05/21/2018  . Polysubstance overdose 05/21/2018  . Traumatic scrotal hematoma 05/18/2016  . Traumatic scrotal hematoma, initial encounter   . 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 CYSTOSCOPY  05/17/2016   Procedure: CYSTOSCOPY FLEXIBLE;  Surgeon: Hollice Espy, MD;  Location: [facility] ORS;  Service: Urology;;  . HEMATOMA EVACUATION N/A 05/17/2016   Procedure: EVACUATION HEMATOMA- 350ML;  Surgeon: Hollice Espy, MD;  Location: [facility] ORS;  Service: Urology;  Laterality: N/A;  . SCROTAL EXPLORATION N/A 05/17/2016   Procedure: SCROTUM EXPLORATION;  Surgeon: Hollice Espy, MD;  Location: [facility] ORS;  Service: Urology;  Laterality: N/A;    Prior to Admission medications   Medication Sig Start Date End Date Taking? Authorizing Provider  cephALEXin (KEFLEX) 500 MG capsule Take 1 capsule (500 mg total) by mouth 3 (three) times daily for 7 days. 05/09/19 05/16/19  Lannie Fields, PA-C     Allergies Patient has no known allergies.  History reviewed. No pertinent family history.  Social History Social History   Tobacco Use  . Smoking status: Current Every Day Smoker  . Smokeless tobacco: Never Used  Substance Use Topics  . Alcohol use: Yes  . Drug use: Yes    Types: Marijuana     Review of Systems  Constitutional: No fever/chills Eyes:  No discharge ENT: No upper respiratory complaints. Respiratory: no cough. No SOB/ use of accessory muscles to breath Gastrointestinal:   No nausea, no vomiting.  No diarrhea.  No constipation. Musculoskeletal: Negative for musculoskeletal pain. Skin: Patient has left thumb laceration.     ____________________________________________   PHYSICAL EXAM:  VITAL SIGNS: ED Triage Vitals [05/09/19 1532]  Enc Vitals Group     BP (!) 165/79     Pulse Rate 75     Resp 18     Temp 98.1 F (36.7 C)     Temp Source Oral     SpO2 97 %     Weight 175 lb (79.4 kg)     Height 5\' 6"  (1.676 m)     Head Circumference      Peak Flow      Pain Score 5     Pain Loc      Pain Edu?      Excl. in [place]?      Constitutional: Alert and oriented. Well appearing and in no acute distress. Eyes: Conjunctivae are normal. PERRL. EOMI. Head: Atraumatic. Cardiovascular: Normal rate, regular rhythm. Normal S1 and S2.  Good peripheral circulation. Respiratory: Normal respiratory effort without tachypnea or retractions.  Lungs CTAB. Good air entry to the bases with no decreased or absent breath sounds Gastrointestinal: Bowel sounds x 4 quadrants. Soft and nontender to palpation. No guarding or rigidity. No distention. Musculoskeletal: Full range of motion to all extremities. No obvious deformities noted Neurologic:  Normal for age. No gross focal neurologic deficits are appreciated.  Skin: Patient has 1 cm well approximated linear left thumb laceration.  Psychiatric: Mood and affect are normal for age. Speech and behavior are normal.    ____________________________________________   LABS (all labs ordered are listed, but only abnormal results are displayed)  Labs Reviewed - No data to display ____________________________________________  EKG   ____________________________________________  RADIOLOGY   No results found.  ____________________________________________    PROCEDURES  Procedure(s) performed:     Marland KitchenMarland KitchenLaceration Repair  Date/Time: 05/09/2019 6:42 PM Performed by: Orvil Feil, PA-C Authorized by: Orvil Feil, PA-C   Consent:    Consent obtained:  Verbal   Consent given by:  Patient   Risks discussed:  Infection, pain, retained foreign body, poor cosmetic result and poor wound healing Anesthesia (see MAR for exact dosages):    Anesthesia method:  None Repair type:    Repair type:  Simple Exploration:    Hemostasis achieved with:  Direct pressure   Wound exploration: entire depth of wound probed and visualized     Contaminated: no   Treatment:    Area cleansed with:  Saline   Amount of cleaning:  Extensive   Irrigation solution:  Sterile saline   Visualized foreign bodies/material removed: no   Skin repair:    Repair method:  Tissue adhesive Approximation:    Approximation:  Close Post-procedure details:    Dressing:  Sterile dressing   Patient tolerance of procedure:  Tolerated well, no immediate complications       Medications  Tdap (BOOSTRIX) injection 0.5 mL (0.5 mLs Intramuscular Given 05/09/19 1625)     ____________________________________________   INITIAL IMPRESSION / ASSESSMENT AND PLAN / ED COURSE  Pertinent labs & imaging results that were available during my care of the patient were reviewed by me and considered in my medical decision making (see chart for details).       Assessment and plan Laceration 24 year old male presents to the emergency department with a left thumb laceration.  Laceration was repaired in the emergency department without  complication using Dermabond.  Tetanus status was updated while he was here.  He was discharged with Keflex.  All patient questions were answered.  ____________________________________________  FINAL CLINICAL IMPRESSION(S) / ED DIAGNOSES  Final diagnoses:  Laceration of left thumb without foreign body without damage to nail, initial encounter      NEW MEDICATIONS STARTED DURING THIS VISIT:  ED Discharge Orders         Ordered    cephALEXin (KEFLEX) 500 MG capsule  3 times daily     05/09/19 1626              This chart was dictated using voice recognition software/Dragon. Despite best efforts to proofread, errors can occur which can change the meaning. Any change was purely unintentional.     Orvil Feil, PA-C 05/09/19 1843    Dionne Bucy, MD 05/09/19 2052

## 2019-05-09 NOTE — ED Triage Notes (Signed)
Pt presents to ED c/o lac to L thumb sustained while cutting vegetables at work ([employer]). Pt states he does NOT want to file WC. Tetanus >53yrs.

## 2019-05-09 NOTE — ED Notes (Signed)
See triage note  Presents with laceration to left thumb with knife   States he was cutting some vegs.  Knife slipped

## 2019-06-17 ENCOUNTER — Encounter: Payer: Self-pay | Admitting: Emergency Medicine

## 2019-06-17 ENCOUNTER — Other Ambulatory Visit: Payer: Self-pay

## 2019-06-17 ENCOUNTER — Ambulatory Visit
Admission: EM | Admit: 2019-06-17 | Discharge: 2019-06-17 | Disposition: A | Discharge status: Home / Self Care | Payer: No Typology Code available for payment source | Attending: Family Medicine | Admitting: Family Medicine

## 2019-06-17 DIAGNOSIS — H6123 Impacted cerumen, bilateral: Secondary | ICD-10-CM

## 2019-06-17 DIAGNOSIS — H938X3 Other specified disorders of ear, bilateral: Secondary | ICD-10-CM

## 2019-06-17 NOTE — ED Triage Notes (Signed)
Pt  C/o left ear fullness x 2 days, he states he cannot hear anything out of that ear currently. Pt states this is a recurrent problem and he has to go to the doctor at least 5 times now to get an irrigation. Pt has tried various otc remedies.

## 2019-06-17 NOTE — Discharge Instructions (Addendum)
Both of your ears were full of wax  Try cleaning daily with 1/2 peroxide and 1/2 water solution  Follow up with primary care or this office as needed

## 2019-06-17 NOTE — ED Provider Notes (Signed)
[facility]   [id number] 06/17/19 Arrival Time: 6160  CC: EAR PAIN  SUBJECTIVE: History from: patient.  Rodney Leblanc is a 24 y.o. male who presents with of bilateral ear fullness and decreased hearing bilaterally for the last 2 days. Denies a precipitating event, such as swimming or wearing ear plugs. Reports that he makes a lot of ear wax and that he has to have his ears irrigated 5-6 times per year. Has tried OTC ear cleaning solutions with no relief.  Denies fever, chills, fatigue, sinus pain, rhinorrhea, ear discharge, sore throat, SOB, wheezing, chest pain, nausea, changes in bowel or bladder habits.    ROS: As per HPI.  All other pertinent ROS negative.     History reviewed. No pertinent past medical history. Past Surgical History:  Procedure Laterality Date  . CYSTOSCOPY  05/17/2016   Procedure: CYSTOSCOPY FLEXIBLE;  Surgeon: Hollice Espy, MD;  Location: [facility] ORS;  Service: Urology;;  . HEMATOMA EVACUATION N/A 05/17/2016   Procedure: EVACUATION HEMATOMA- 350ML;  Surgeon: Hollice Espy, MD;  Location: [facility];  Service: Urology;  Laterality: N/A;  . SCROTAL EXPLORATION N/A 05/17/2016   Procedure: SCROTUM EXPLORATION;  Surgeon: Hollice Espy, MD;  Location: [facility] OR[facility];  Service: Urology;  Laterality: N/A;   No Known Allergies No current facility-administered medications on file prior to encounter.   No current outpatient medications on file prior to encounter.   Social History   Socioeconomic History  . Marital status: Single    Spouse name: Not on file  . Number of children: Not on file  . Years of education: Not on file  . Highest education level: Not on file  Occupational History  . Not on file  Tobacco Use  . Smoking status: Current Every Day Smoker  . Smokeless tobacco: Never Used  Substance and Sexual Activity  . Alcohol use: Yes  . Drug use: Yes    Types: Marijuana  . Sexual activity: Not on file  Other Topics Concern  . Not on file  Social  History Narrative  . Not on file   Social Determinants of Health   Financial Resource Strain:   . Difficulty of Paying Living Expenses:   Food Insecurity:   . Worried About Charity fundraiser in the Last Year:   . Arboriculturist in the Last Year:   Transportation Needs:   . Film/video editor (Medical):   Marland Kitchen Lack of Transportation (Non-Medical):   Physical Activity:   . Days of Exercise per Week:   . Minutes of Exercise per Session:   Stress:   . Feeling of Stress :   Social Connections:   . Frequency of Communication with Friends and Family:   . Frequency of Social Gatherings with Friends and Family:   . Attends Religious Services:   . Active Member of Clubs or Organizations:   . Attends Archivist Meetings:   Marland Kitchen Marital Status:   Intimate Partner Violence:   . Fear of Current or Ex-Partner:   . Emotionally Abused:   Marland Kitchen Physically Abused:   . Sexually Abused:    History reviewed. No pertinent family history.  OBJECTIVE:  Vitals:   06/17/19 1513  BP: (!) 148/76  Pulse: 70  Resp: 18  Temp: 98.7 F (37.1 C)  TempSrc: Oral  SpO2: 97%     General appearance: alert; appears fatigued HEENT: Ears: bilateral EACs with cerumen impaction, TMs pearly gray with visible cone of light, without erythema; Eyes: PERRL, EOMI  grossly; Sinuses nontender to palpation; Nose: clear rhinorrhea; Throat: oropharynx mildly erythematous, tonsils 1+ without white tonsillar exudates, uvula midline Neck: supple without LAD Lungs: unlabored respirations, symmetrical air entry; cough: absent; no respiratory distress Heart: regular rate and rhythm.  Radial pulses 2+ symmetrical bilaterally Skin: warm and dry Psychological: alert and cooperative; normal mood and affect  Imaging: No results found.   ASSESSMENT & PLAN:  1. Ear fullness, bilateral   2. Bilateral impacted cerumen     Ear Fullness, bilateral Cerumen Impaction, bilateral Bilateral ear irrigation in office  today Continue to use OTC ibuprofen and/ or tylenol as needed for pain control Follow up with PCP if symptoms persists Return here or go to the ER if you have any new or worsening symptoms   Reviewed expectations re: course of current medical issues. Questions answered. Outlined signs and symptoms indicating need for more acute intervention. Patient verbalized understanding. After Visit Summary given.          Moshe Cipro, NP 06/17/19 1553

## 2019-06-23 DIAGNOSIS — Z87891 Personal history of nicotine dependence: Secondary | ICD-10-CM | POA: Insufficient documentation

## 2019-10-17 DIAGNOSIS — Z00129 Encounter for routine child health examination without abnormal findings: Secondary | ICD-10-CM | POA: Insufficient documentation

## 2019-10-17 DIAGNOSIS — E875 Hyperkalemia: Secondary | ICD-10-CM | POA: Insufficient documentation

## 2020-06-11 ENCOUNTER — Emergency Department: Payer: Self-pay

## 2020-06-11 ENCOUNTER — Emergency Department
Admission: EM | Admit: 2020-06-11 | Discharge: 2020-06-11 | Disposition: A | Discharge status: Home / Self Care | Payer: Self-pay | Attending: Emergency Medicine | Admitting: Emergency Medicine

## 2020-06-11 ENCOUNTER — Encounter: Payer: Self-pay | Admitting: Emergency Medicine

## 2020-06-11 ENCOUNTER — Other Ambulatory Visit: Payer: Self-pay

## 2020-06-11 DIAGNOSIS — Y92830 Public park as the place of occurrence of the external cause: Secondary | ICD-10-CM | POA: Insufficient documentation

## 2020-06-11 DIAGNOSIS — S93602A Unspecified sprain of left foot, initial encounter: Secondary | ICD-10-CM | POA: Insufficient documentation

## 2020-06-11 DIAGNOSIS — S99919A Unspecified injury of unspecified ankle, initial encounter: Secondary | ICD-10-CM

## 2020-06-11 DIAGNOSIS — Y9351 Activity, roller skating (inline) and skateboarding: Secondary | ICD-10-CM | POA: Insufficient documentation

## 2020-06-11 DIAGNOSIS — F17200 Nicotine dependence, unspecified, uncomplicated: Secondary | ICD-10-CM | POA: Insufficient documentation

## 2020-06-11 DIAGNOSIS — W19XXXA Unspecified fall, initial encounter: Secondary | ICD-10-CM | POA: Insufficient documentation

## 2020-06-11 MED ORDER — IBUPROFEN 600 MG PO TABS: 600.0000 mg | ORAL_TABLET | Freq: Four times a day (QID) | ORAL | 0 refills | Status: AC | PRN

## 2020-06-11 MED ORDER — IBUPROFEN 600 MG PO TABS
600.0000 mg | ORAL_TABLET | Freq: Once | ORAL | Status: DC
  Filled 2020-06-11: qty 1

## 2020-06-11 NOTE — ED Provider Notes (Signed)
[facility] Emergency Department Provider Note   ____________________________________________   Event Date/Time   First MD Initiated Contact with Patient 06/11/20 1000     (approximate)  I have reviewed the triage vital signs and the nursing notes.   HISTORY  Chief Complaint Ankle Pain    HPI Rodney Leblanc is a 25 y.o. male resents to the ED with complaint of left foot pain.  Patient states that he fell [place] skate[place] park yesterday.  This morning he woke with increased pain and inability to bear weight.  Patient denies any head injury or loss of consciousness during his fall.  He has not taken any over-the-counter medication.  He denies any previous injury to his foot.  He rates his pain as 6 out of 10.       History reviewed. No pertinent past medical history.  Patient Active Problem List   Diagnosis Date Noted  . Attention deficit hyperactivity disorder 05/21/2018  . Allergic rhinitis 05/21/2018  . Awareness of heartbeats 05/21/2018  . Cannot sleep 05/21/2018  . 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 Episodic mood disorder ([facility]) 05/21/2018  . Polysubstance overdose 05/21/2018  . Traumatic scrotal hematoma 05/18/2016  . Traumatic scrotal hematoma, initial encounter   . 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 CYSTOSCOPY  05/17/2016   Procedure: CYSTOSCOPY FLEXIBLE;  Surgeon: Vanna Scotland, MD;  Location: [facility] OR[facility];  Service: Urology;;  . HEMATOMA EVACUATION N/A 05/17/2016   Procedure: EVACUATION HEMATOMA- ;  Surgeon: Vanna Scotland, MD;  Location: [facility] OR[facility];  Service: Urology;  Laterality: N/A;  . SCROTAL EXPLORATION N/A 05/17/2016   Procedure: SCROTUM EXPLORATION;  Surgeon: Vanna Scotland, MD;  Location: [facility] OR[facility];  Service: Urology;  Laterality: N/A;    Prior to Admission medications   Medication Sig Start Date End Date Taking? Authorizing Provider  ibuprofen (ADVIL) 600 MG tablet  Take 1 tablet (600 mg total) by mouth every 6 (six) hours as needed. 06/11/20  Yes Tommi Rumps, PA-C    Allergies Patient has no known allergies.  History reviewed. No pertinent family history.  Social History Social History   Tobacco Use  . Smoking status: Current Every Day Smoker  . Smokeless tobacco: Never Used  Substance Use Topics  . Alcohol use: Yes  . Drug use: Yes    Types: Marijuana    Review of Systems Constitutional: No fever/chills Eyes: No visual changes. ENT: No injury. Cardiovascular: Denies chest pain. Respiratory: Denies shortness of breath. Gastrointestinal: No abdominal pain.  No nausea, no vomiting.  Genitourinary: Negative for dysuria. Musculoskeletal: Positive left foot pain. Skin: Negative for rash. Neurological: Negative for headaches, focal weakness or numbness.  ____________________________________________   PHYSICAL EXAM:  VITAL SIGNS: ED Triage Vitals [06/11/20 0947]  Enc Vitals Group     BP (!) 150/88     Pulse Rate 98     Resp 18     Temp 98 F (36.7 C)     Temp Source Oral     SpO2 99 %     Weight 175 lb (79.4 kg)     Height 5\' 6"  (1.676 m)     Head Circumference      Peak Flow      Pain Score 6     Pain Loc      Pain Edu?      Excl. in [place]?     Constitutional: Alert and oriented. Well appearing and in no acute distress.  Eyes: Conjunctivae are normal. PERRL. EOMI. Head: Atraumatic. Nose: No injury. Mouth/Throat: No injury. Neck: No stridor.   Cardiovascular: Normal rate, regular rhythm. Grossly normal heart sounds.  Good peripheral circulation. Respiratory: Normal respiratory effort.  No retractions. Lungs CTAB. Musculoskeletal: On examination of the left foot there is moderate tenderness, soft tissue edema and ecchymosis on the lateral aspect of the left foot.  Gait is intact.  Motor sensory function distally is intact.  Capillary refills less than 3 seconds.  No tenderness is noted on palpation of the ankle  mortise. Neurologic:  Normal speech and language. No gross focal neurologic deficits are appreciated.  Skin:  Skin is warm, dry and intact. No rash noted. Psychiatric: Mood and affect are normal. Speech and behavior are normal.  ____________________________________________   LABS (all labs ordered are listed, but only abnormal results are displayed)  Labs Reviewed - No data to display ____________________________________________  RADIOLOGY I, Tommi Rumps, personally viewed and evaluated these images (plain radiographs) as part of my medical decision making, as well as reviewing the written report by the radiologist.   Official radiology report(s): DG Foot Complete Left  Result Date: 06/11/2020 CLINICAL DATA:  Foot injury with pain. EXAM: LEFT FOOT - COMPLETE 3+ VIEW COMPARISON:  None. FINDINGS: There is no evidence of fracture or dislocation. There is no evidence of arthropathy or other focal bone abnormality. Soft tissues are unremarkable. IMPRESSION: Negative. Electronically Signed   By: Kennith Center M.D.   On: 06/11/2020 10:29    ____________________________________________   PROCEDURES  Procedure(s) performed (including Critical Care):  Procedures   ____________________________________________   INITIAL IMPRESSION / ASSESSMENT AND PLAN / ED COURSE  As part of my medical decision making, I reviewed the following data within the electronic MEDICAL RECORD NUMBER Notes from prior ED visits and [place] Controlled Substance Database  25 year old male presents to the ED with complaint of left foot pain after falling at a skateboard park yesterday.  He woke this morning with increased soft tissue edema and inability to bear weight without increased pain.  X-rays were negative and patient was made aware.  An Ace wrap and crutches were given to the patient by ED provider.  A prescription for ibuprofen was sent to the pharmacy to take every 6 hours as needed for pain and inflammation.  He  is to ice and elevate as needed for discomfort.  Follow-up with Dr. Hyacinth Meeker who is on-call for orthopedics if any continued problems or not improving.  ____________________________________________   FINAL CLINICAL IMPRESSION(S) / ED DIAGNOSES  Final diagnoses:  Ankle injury  Sprain of left foot, initial encounter     ED Discharge Orders         Ordered    ibuprofen (ADVIL) 600 MG tablet  Every 6 hours PRN        06/11/20 1052           Note:  This document was prepared using Dragon voice recognition software and may include unintentional dictation errors.    Tommi Rumps, PA-C 06/11/20 1215    Chesley Noon, MD 06/11/20 [phone]

## 2020-06-11 NOTE — Discharge Instructions (Signed)
Follow-up with your primary care provider or Dr. Hyacinth Meeker who is on-call for orthopedics if not improving in 7 to 10 days.  Ice and elevation to reduce swelling and help with pain.  Ibuprofen 600 mg 3 times daily with food which will help with pain and inflammation.  You may also take Tylenol in addition to this medication if additional pain medication is needed.  Use crutches until you are able to stand without pain.  In approximately 2 days have your blood pressure rechecked.  Your blood pressure may be elevated due to pain especially when you are at urgent care.  No sports for 2 to 3 weeks to prevent reinjury.

## 2020-06-11 NOTE — ED Notes (Signed)
See triage note  States he fell yesterday while skating  Having pain pain to lateral left foot  Min swelling with redness noted  Good pulses

## 2020-06-11 NOTE — ED Triage Notes (Signed)
Pt was at [employer] park yesterday and injured left ankle. Pt woke up this morning with severe pain and was unable to bear weight on it.

## 2020-08-06 ENCOUNTER — Encounter: Payer: Self-pay | Admitting: Emergency Medicine

## 2020-08-06 ENCOUNTER — Other Ambulatory Visit: Payer: Self-pay

## 2020-08-06 ENCOUNTER — Emergency Department
Admission: EM | Admit: 2020-08-06 | Discharge: 2020-08-06 | Disposition: A | Discharge status: Home / Self Care | Payer: Self-pay | Attending: Emergency Medicine | Admitting: Emergency Medicine

## 2020-08-06 DIAGNOSIS — L089 Local infection of the skin and subcutaneous tissue, unspecified: Secondary | ICD-10-CM

## 2020-08-06 DIAGNOSIS — F17200 Nicotine dependence, unspecified, uncomplicated: Secondary | ICD-10-CM | POA: Insufficient documentation

## 2020-08-06 DIAGNOSIS — L723 Sebaceous cyst: Secondary | ICD-10-CM | POA: Insufficient documentation

## 2020-08-06 MED ORDER — OXYCODONE-ACETAMINOPHEN 5-325 MG PO TABS
1.0000 | ORAL_TABLET | Freq: Once | ORAL | Status: AC
  Administered 2020-08-06: 1 via ORAL
  Filled 2020-08-06: qty 1

## 2020-08-06 MED ORDER — HYDROCODONE-ACETAMINOPHEN 5-325 MG PO TABS: 1.0000 | ORAL_TABLET | ORAL | 0 refills | Status: AC | PRN

## 2020-08-06 MED ORDER — SULFAMETHOXAZOLE-TRIMETHOPRIM 800-160 MG PO TABS
1.0000 | ORAL_TABLET | Freq: Once | ORAL | Status: AC
  Administered 2020-08-06: 1 via ORAL
  Filled 2020-08-06: qty 1

## 2020-08-06 MED ORDER — LIDOCAINE HCL (PF) 1 % IJ SOLN
10.0000 mL | Freq: Once | INTRAMUSCULAR | Status: AC
  Administered 2020-08-06: 10 mL
  Filled 2020-08-06: qty 10

## 2020-08-06 MED ORDER — SULFAMETHOXAZOLE-TRIMETHOPRIM 800-160 MG PO TABS: 1.0000 | ORAL_TABLET | Freq: Two times a day (BID) | ORAL | 0 refills | Status: DC

## 2020-08-06 NOTE — ED Notes (Signed)
E-signature pad unavailable - Pt verbalized understanding of D/C information - no additional concerns at this time.  

## 2020-08-06 NOTE — ED Triage Notes (Signed)
Abscess on right bottock for 4 days.  Increasing in size.

## 2020-08-06 NOTE — ED Notes (Signed)
Pt states "abscess" has been there for approx 15 years but increased in size after his girlfriend tried to squeeze it a few days ago. Pt states it increased from the approximate size of a dime to the size of a tennis ball. Upon assessment, visualized large erythematous mass on right GM with a large central palor. Pt denies denies any fever/chills.

## 2020-08-06 NOTE — ED Provider Notes (Signed)
[facility] Emergency Department Provider Note  ____________________________________________  Time seen: Approximately 3:49 PM  I have reviewed the triage vital signs and the nursing notes.   HISTORY  Chief Complaint Abscess    HPI Rodney Leblanc is a 25 y.o. male who presents the emergency department complaining of a possible abscess to the right buttocks.  Patient states that he has had an area to the right buttocks that is recurrently returning.  He states that it feels round and hard, and it will be "popped" underneath the skin and then eventually returned.  This area has become exceptionally painful, erythematous and edematous.  There is no extension into the intergluteal cleft.  No perirectal/perianal pain or skin changes.  No fevers or chills.  No GI complaints.       History reviewed. No pertinent past medical history.  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 Authorizing  Provider  HYDROcodone-acetaminophen (NORCO/VICODIN) 5-325 MG tablet Take 1 tablet by mouth every 4 (four) hours as needed for moderate pain. 08/06/20 08/06/21 Yes Ltanya Bayley, Delorise Royals, PA-C  sulfamethoxazole-trimethoprim (BACTRIM DS) 800-160 MG tablet Take 1 tablet by mouth 2 (two) times daily. 08/06/20  Yes Ja Ohman, Delorise Royals, PA-C  ibuprofen (ADVIL) 600 MG tablet Take 1 tablet (600 mg total) by mouth every 6 (six) hours as needed. 06/11/20   Tommi Rumps, PA-C    Allergies Patient has no known allergies.  No family history on file.  Social History Social History   Tobacco Use   Smoking status: Every Day   Smokeless tobacco: Former  Building services engineer Use: Every day  Substance Use Topics   Alcohol use: Yes   Drug use: Yes    Types: Marijuana     Review of Systems  Constitutional: No fever/chills Eyes: No visual changes. No discharge ENT: No upper respiratory complaints. Cardiovascular: no chest pain. Respiratory: no cough. No SOB. Gastrointestinal: No abdominal pain.  No nausea, no vomiting.  No diarrhea.  No constipation. Musculoskeletal: Negative for musculoskeletal pain. Skin: Possible abscess to the right buttocks Neurological: Negative for headaches, focal weakness or numbness.  10 System ROS otherwise negative.  ____________________________________________   PHYSICAL EXAM:  VITAL SIGNS: ED Triage Vitals  Enc Vitals Group     BP 08/06/20 1307 (!) 142/78     Pulse Rate 08/06/20 1307 70     Resp 08/06/20 1307 16     Temp 08/06/20 1307 98.3 F (36.8 C)     Temp Source 08/06/20 1307 Oral     SpO2 08/06/20  1307 100 %     Weight 08/06/20 1307 175 lb (79.4 kg)     Height 08/06/20 1307 5\' 6"  (1.676 m)     Head Circumference --      Peak Flow --      Pain Score 08/06/20 1318 4     Pain Loc --      Pain Edu? --      Excl. in GC? --      Constitutional: Alert and oriented. Well appearing and in no acute distress. Eyes: Conjunctivae are normal. PERRL.  EOMI. Head: Atraumatic. ENT:      Ears:       Nose: No congestion/rhinnorhea.      Mouth/Throat: Mucous membranes are moist.  Neck: No stridor.    Cardiovascular: Normal rate, regular rhythm. Normal S1 and S2.  Good peripheral circulation. Respiratory: Normal respiratory effort without tachypnea or retractions. Lungs CTAB. Good air entry to the bases with no decreased or absent breath sounds. Musculoskeletal: Full range of motion to all extremities. No gross deformities appreciated. Neurologic:  Normal speech and language. No gross focal neurologic deficits are appreciated.  Skin:  Skin is warm, dry and intact. No rash noted.  Visualization of the right buttock reveals an erythematous lesion consistent with abscess with firmness with a central fluctuant area.  On palpation there appears that there may be a cystic structure underlying but it is difficult to palpate given the amount of firmness and fluctuance. Psychiatric: Mood and affect are normal. Speech and behavior are normal. Patient exhibits appropriate insight and judgement.   ____________________________________________   LABS (all labs ordered are listed, but only abnormal results are displayed)  Labs Reviewed - No data to display ____________________________________________  EKG   ____________________________________________  RADIOLOGY   No results found.  ____________________________________________    PROCEDURES  Procedure(s) performed:    07/31/22Marland KitchenIncision and Drainage  Date/Time: 08/06/2020 4:48 PM Performed by: 08/08/2020, PA-C Authorized by: Racheal Patches, PA-C   Consent:    Consent obtained:  Verbal   Consent given by:  Patient   Risks discussed:  Bleeding, incomplete drainage and pain   Alternatives discussed:  Referral Universal protocol:    Procedure explained and questions answered to patient or proxy's satisfaction: yes     Patient identity confirmed:  Verbally with  patient Location:    Type:  Cyst (infected cyst)   Location:  Lower extremity   Lower extremity location:  Buttock   Buttock location:  R buttock Sedation:    Sedation type:  None Anesthesia:    Anesthesia method:  Local infiltration   Local anesthetic:  Lidocaine 1% w/o epi Procedure type:    Complexity:  Complex Procedure details:    Incision types:  Single straight   Incision depth:  Subcutaneous   Wound management:  Probed and deloculated   Drainage:  Bloody and purulent (cystic material)   Drainage amount:  Copious   Wound treatment:  Wound left open Post-procedure details:    Procedure completion:  Tolerated well, no immediate complications Comments:     Local filtration with lidocaine is placed over abscess/cyst.  Using forceps, area was probed for deloculated station with significant purulent and cystic material expressed.  Wound was left open.  No packing placed at this time.  Dressed at bedside.    Medications  lidocaine (PF) (XYLOCAINE) 1 % injection 10 mL (10 mLs Infiltration Given by Other 08/06/20 1601)  sulfamethoxazole-trimethoprim (BACTRIM DS) 800-160 MG per tablet 1 tablet (1 tablet  Oral Given 08/06/20 1634)  oxyCODONE-acetaminophen (PERCOCET/ROXICET) 5-325 MG per tablet 1 tablet (1 tablet Oral Given 08/06/20 1700)     ____________________________________________   INITIAL IMPRESSION / ASSESSMENT AND PLAN / ED COURSE  Pertinent labs & imaging results that were available during my care of the patient were reviewed by me and considered in my medical decision making (see chart for details).  Review of the [place] CSRS was performed in accordance of the [facility] prior to dispensing any controlled drugs.           Patient's diagnosis is consistent with infected sebaceous cyst.  Patient presented to the emergency department complaining of a lesion to the right buttocks.  He has had a recurrent lesion for the past 15 years that sounds as if it was a sebaceous cyst.   Patient had an area concerning for infection in the same area and differential included abscess versus infected sebaceous cyst versus abscess next to a sebaceous cyst.  Incision and drainage was performed given the physical exam findings and a large amount of purulence was appreciated.  There was also cystic material consistent with infected sebaceous cyst.  This time patient be placed on antibiotics and instructed to follow-up with dermatology once this is healed tomorrow further remove all the cystic wall.  Return precautions discussed with the patient.  He will have prescription for antibiotic and limited pain medication. Patient is given ED precautions to return to the ED for any worsening or new symptoms.     ____________________________________________  FINAL CLINICAL IMPRESSION(S) / ED DIAGNOSES  Final diagnoses:  Infected sebaceous cyst      NEW MEDICATIONS STARTED DURING THIS VISIT:  ED Discharge Orders          Ordered    sulfamethoxazole-trimethoprim (BACTRIM DS) 800-160 MG tablet  2 times daily        08/06/20 1646    HYDROcodone-acetaminophen (NORCO/VICODIN) 5-325 MG tablet  Every 4 hours PRN        08/06/20 1646                This chart was dictated using voice recognition software/Dragon. Despite best efforts to proofread, errors can occur which can change the meaning. Any change was purely unintentional.    Racheal Patches, PA-C 08/06/20 1716    Merwyn Katos, MD 08/06/20 [phone]

## 2021-06-02 ENCOUNTER — Emergency Department
Admission: EM | Admit: 2021-06-02 | Discharge: 2021-06-02 | Disposition: A | Discharge status: Home / Self Care | Payer: Self-pay | Attending: Emergency Medicine | Admitting: Emergency Medicine

## 2021-06-02 ENCOUNTER — Other Ambulatory Visit: Payer: Self-pay

## 2021-06-02 ENCOUNTER — Encounter: Payer: Self-pay | Admitting: Emergency Medicine

## 2021-06-02 DIAGNOSIS — E860 Dehydration: Secondary | ICD-10-CM | POA: Insufficient documentation

## 2021-06-02 DIAGNOSIS — R55 Syncope and collapse: Secondary | ICD-10-CM | POA: Insufficient documentation

## 2021-06-02 DIAGNOSIS — W260XXA Contact with knife, initial encounter: Secondary | ICD-10-CM | POA: Insufficient documentation

## 2021-06-02 DIAGNOSIS — S61012A Laceration without foreign body of left thumb without damage to nail, initial encounter: Secondary | ICD-10-CM | POA: Insufficient documentation

## 2021-06-02 LAB — CBC
HCT: 47.4 % (ref 39.0–52.0)
Hemoglobin: 16.1 g/dL (ref 13.0–17.0)
MCH: 30.8 pg (ref 26.0–34.0)
MCHC: 34 g/dL (ref 30.0–36.0)
MCV: 90.8 fL (ref 80.0–100.0)
Platelets: 296 10*3/uL (ref 150–400)
RBC: 5.22 MIL/uL (ref 4.22–5.81)
RDW: 11.9 % (ref 11.5–15.5)
WBC: 8.1 10*3/uL (ref 4.0–10.5)
nRBC: 0 % (ref 0.0–0.2)

## 2021-06-02 LAB — BASIC METABOLIC PANEL
Anion gap: 11 (ref 5–15)
BUN: 10 mg/dL (ref 6–20)
CO2: 22 mmol/L (ref 22–32)
Calcium: 8.8 mg/dL — ABNORMAL LOW (ref 8.9–10.3)
Chloride: 104 mmol/L (ref 98–111)
Creatinine, Ser: 0.8 mg/dL (ref 0.61–1.24)
GFR, Estimated: >60 mL/min (ref >60)
Glucose, Bld: 93 mg/dL (ref 70–99)
Potassium: 3.3 mmol/L — ABNORMAL LOW (ref 3.5–5.1)
Sodium: 137 mmol/L (ref 135–145)

## 2021-06-02 LAB — URINALYSIS, ROUTINE W REFLEX MICROSCOPIC
Bilirubin Urine: NEGATIVE
Glucose, UA: NEGATIVE mg/dL
Hgb urine dipstick: NEGATIVE
Ketones, ur: NEGATIVE mg/dL
Leukocytes,Ua: NEGATIVE
Nitrite: NEGATIVE
Protein, ur: NEGATIVE mg/dL
Specific Gravity, Urine: 1.002 — ABNORMAL LOW (ref 1.005–1.030)
pH: 7 (ref 5.0–8.0)

## 2021-06-02 MED ORDER — LIDOCAINE HCL (PF) 1 % IJ SOLN: 5.0000 mL | Freq: Once | INTRAMUSCULAR | Status: DC

## 2021-06-02 MED ORDER — LACTATED RINGERS IV BOLUS
1000.0000 mL | Freq: Once | INTRAVENOUS | Status: AC
  Administered 2021-06-02: 1000 mL via INTRAVENOUS

## 2021-06-02 NOTE — ED Notes (Signed)
AVS with prescriptions provided to and discussed with patient and family member at bedside. Pt verbalizes understanding of discharge instructions and denies any questions or concerns at this time. Pt has ride home. Pt ambulated out of department independently with steady gait.  

## 2021-06-02 NOTE — ED Triage Notes (Signed)
Pt to ED via [facility] from work for multiple syncopal episodes and laceration to the thumb. Pt reports that he was cutting some chicken and cut his thumb, pt states that there was a lot of blood coming from his thumb. Pt reports that he went to wash his hand and had a syncopal episode. Pt states that his coworkers told him that he got up and passed out 2 more times. Pt states that he remembers sitting a the table drinking water because he felt very hot and he remembers passing out again, for a total of 4 syncopal episodes. Pt hit his left eye when he had first syncopal episode. Pt states that he did have 2 22 oz beers earlier today and has not had any food since lunch time yesterday. Pt is c/o headache.   Medic truck on scene reported initial blood pressure of 80/44, initial HR was 42 but dropped.

## 2021-06-02 NOTE — ED Provider Notes (Signed)
[facility] Provider Note    Event Date/Time   First MD Initiated Contact with Patient 06/02/21 1728     (approximate)   History   Chief Complaint: Loss of Consciousness and Laceration   HPI  Rodney Leblanc is a 26 y.o. male with no significant past medical history who comes the ED complaining of a thumb laceration and syncope.  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  He inadvertently cut into his thumb which bled profusely.  Denies any other injuries.  He was asymptomatic prior to this happening.  He denies any current headache chest pain shortness of breath cough fever belly pain vomiting or diarrhea.  Last tetanus vaccine was 2 years ago     Physical Exam   Triage Vital Signs: ED Triage Vitals  Enc Vitals Group     BP 06/02/21 1720 (!) 136/91     Pulse Rate 06/02/21 1720 64     Resp 06/02/21 1720 16     Temp 06/02/21 1728 97.7 F (36.5 C)     Temp Source 06/02/21 1728 Oral     SpO2 06/02/21 1720 98 %     Weight 06/02/21 1730 180 lb (81.6 kg)     Height 06/02/21 1730 5\' 6"  (1.676 m)     Head Circumference --      Peak Flow --      Pain Score 06/02/21 1730 7     Pain Loc --      Pain Edu? --      Excl. in GC? --     Most recent vital signs: Vitals:   06/02/21 1730 06/02/21 1800  BP: 125/80 127/72  Pulse: (!) 57 (!) 54  Resp: (!) 23 19  Temp:    SpO2: 99% 100%    General: Awake, no distress.  CV:  Good peripheral perfusion.  Regular rate and rhythm, heart rate of 60 Resp:  Normal effort.  Clear to auscultation bilaterally Abd:  No distention.  Soft and nontender Other:  No lower extremity edema, no rash.  Moist oral mucosa   ED Results / Procedures / Treatments   Labs (all labs ordered are listed, but only abnormal results are  displayed) Labs Reviewed  BASIC METABOLIC PANEL - Abnormal; Notable for the following components:      Result Value   Potassium 3.3 (*)    Calcium 8.8 (*)    All other components within normal limits  URINALYSIS, ROUTINE W REFLEX MICROSCOPIC - Abnormal; Notable for the following components:   Color, Urine STRAW (*)    APPearance CLEAR (*)    Specific Gravity, Urine 1.002 (*)    All other components within normal limits  CBC  CBG MONITORING, ED     EKG Interpreted by me Sinus bradycardia rate of 56.  Normal axis and intervals.  Normal QRS ST segments and T waves.  Overall appears normal.   RADIOLOGY    PROCEDURES:  .1-3 Lead EKG Interpretation Performed by: 06/04/21, MD Authorized by: Sharman Cheek, MD     Interpretation: abnormal     ECG rate:  60   ECG rate assessment: normal     Rhythm: sinus rhythm     Ectopy: none     Conduction: normal   Comments:        Sharman CheekMarland Kitchen  Laceration Repair  Date/Time: 06/02/2021 6:49 PM Performed by: Sharman Cheek, MD Authorized by: Sharman Cheek, MD   Consent:    Consent obtained:  Verbal   Consent given by:  Patient   Risks, benefits, and alternatives were discussed: yes     Risks discussed:  Infection, pain and need for additional repair Universal protocol:    Patient identity confirmed:  Verbally with patient Anesthesia:    Anesthesia method:  None Laceration details:    Location:  Finger   Finger location:  L thumb   Length (cm):  1.5 Pre-procedure details:    Preparation:  Patient was prepped and draped in usual sterile fashion Exploration:    Limited defect created (wound extended): no     Hemostasis achieved with:  Direct pressure   Wound exploration: wound explored through full range of motion and entire depth of wound visualized     Wound extent: no foreign bodies/material noted, no muscle damage noted, no nerve damage noted, no tendon damage noted, no underlying fracture noted and no vascular  damage noted     Contaminated: no   Treatment:    Area cleansed with:  Povidone-iodine and saline   Amount of cleaning:  Standard   Irrigation solution:  Sterile water   Irrigation method:  Pressure wash   Debridement:  None   Undermining:  None Skin repair:    Repair method:  Tissue adhesive Approximation:    Approximation:  Close Repair type:    Repair type:  Simple Post-procedure details:    Dressing:  Sterile dressing   Procedure completion:  Tolerated well, no immediate complications   MEDICATIONS ORDERED IN ED: Medications  lactated ringers bolus 1,000 mL (1,000 mLs Intravenous New Bag/Given 06/02/21 1749)     IMPRESSION / MDM / ASSESSMENT AND PLAN / ED COURSE  I reviewed the triage vital signs and the nursing notes.                              Differential diagnosis includes, but is not limited to, dehydration, anemia, electrolyte abnormality, renal insufficiency, vagal episode  Patient's presentation is most consistent with acute presentation with potential threat to life or bodily function.  Patient presents with multiple episodes of syncope after cutting his thumb with a kitchen knife.  No worrisome preceding pain syndrome or other complaints.  By history this is consistent with vasovagal episode, explaining his initial bradycardia and hypotension with EMS.  In the ED on my evaluation he has normal vital signs and denies any symptoms.  I recommended laceration closure with sutures due to the pressure bearing area on the thumb.  Patient declines and request tissue adhesive, so the wound was repaired with Dermabond.  He is agreeable to returning to care if the laceration reopens or has any signs of infection.  He is currently clinically sober.  He is not septic.       FINAL CLINICAL IMPRESSION(S) / ED DIAGNOSES   Final diagnoses:  Thumb laceration, left, initial encounter  Dehydration  Vasovagal syncope     Rx / DC Orders   ED Discharge Orders     None         Note:  This document was prepared using Dragon voice recognition software and may include unintentional dictation errors.   Sharman Cheek, MD 06/02/21 1850

## 2021-09-06 IMAGING — DX DG FOOT COMPLETE 3+V*L*
3 series · 3 of 3 positions shown · non-contrast
Comparison: None.

CLINICAL DATA: Foot injury with pain.

EXAM:
LEFT FOOT - COMPLETE 3+ VIEW

[foot ap]
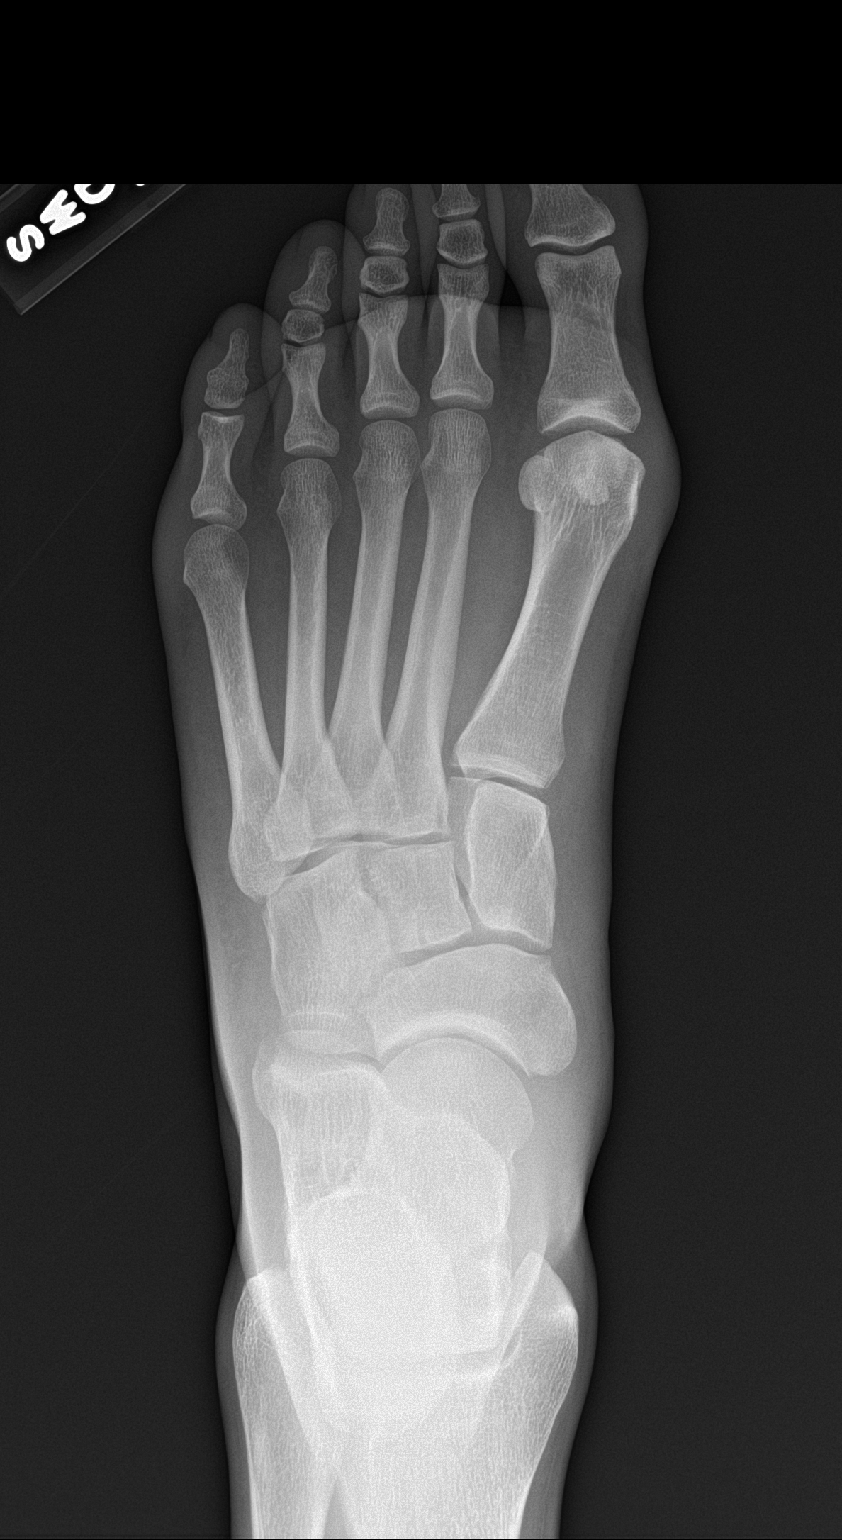

[foot obl]
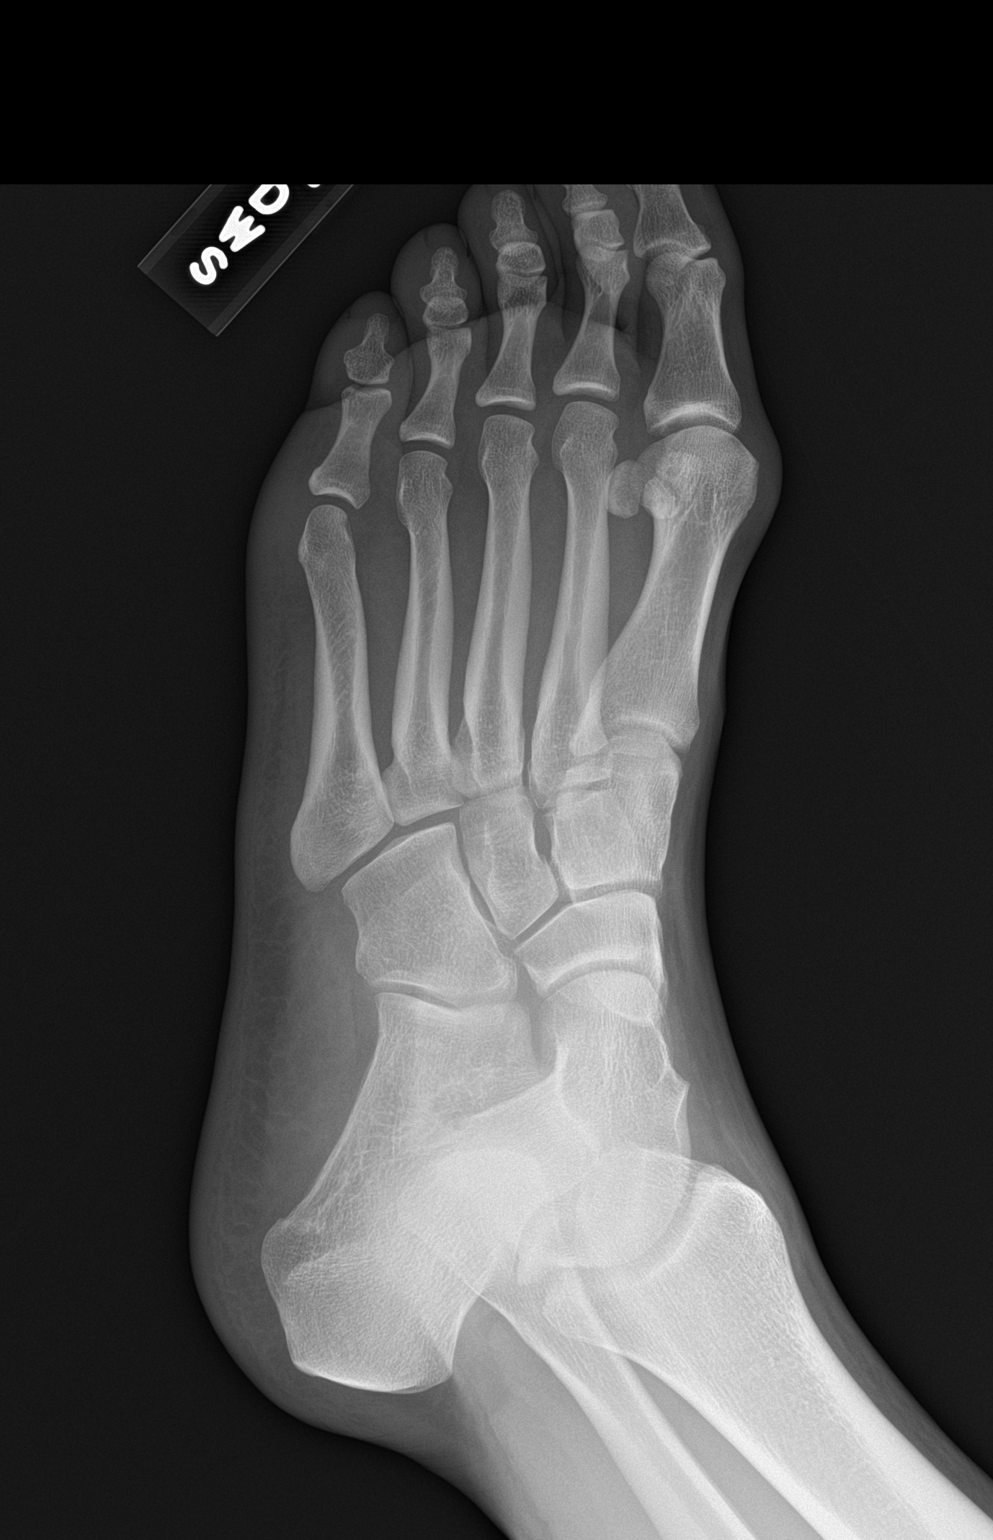

[foot lat]
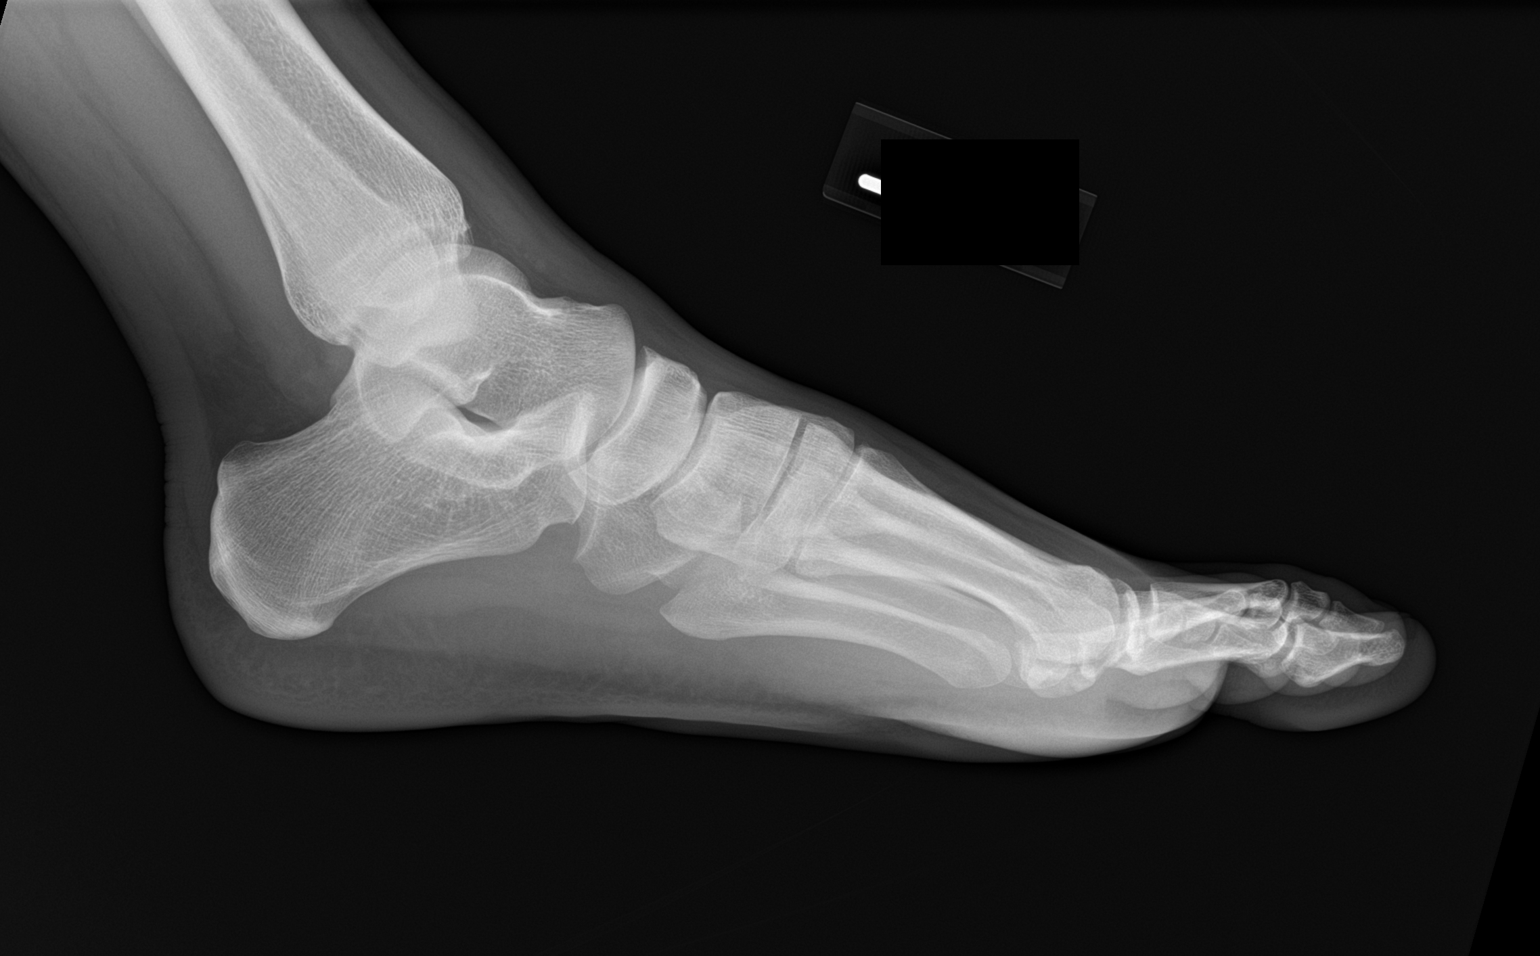

[3 of 3 positions shown; findings below may reference images not displayed]

FINDINGS: There is no evidence of fracture or dislocation. There is no
evidence of arthropathy or other focal bone abnormality. Soft
tissues are unremarkable.
IMPRESSION: Negative.

## 2022-10-30 ENCOUNTER — Ambulatory Visit
Admission: EM | Admit: 2022-10-30 | Discharge: 2022-10-30 | Disposition: A | Discharge status: Home / Self Care | Payer: Self-pay | Attending: Physician Assistant | Admitting: Physician Assistant

## 2022-10-30 DIAGNOSIS — S61512A Laceration without foreign body of left wrist, initial encounter: Secondary | ICD-10-CM

## 2022-10-30 DIAGNOSIS — Z9229 Personal history of other drug therapy: Secondary | ICD-10-CM | POA: Insufficient documentation

## 2022-10-30 DIAGNOSIS — Z23 Encounter for immunization: Secondary | ICD-10-CM | POA: Insufficient documentation

## 2022-10-30 DIAGNOSIS — B1920 Unspecified viral hepatitis C without hepatic coma: Secondary | ICD-10-CM | POA: Insufficient documentation

## 2022-10-30 MED ORDER — CEPHALEXIN 500 MG PO CAPS: 500.0000 mg | ORAL_CAPSULE | Freq: Two times a day (BID) | ORAL | 0 refills | Status: AC

## 2022-10-30 NOTE — ED Triage Notes (Signed)
Pt c/o laceration in L wrist that occurred today. States he cut himself with box cutter. Last tetanus 05/09/19.

## 2022-10-30 NOTE — ED Provider Notes (Signed)
[facility]    CSN: [id number] Arrival date & time: 10/30/22  1110      History   Chief Complaint Chief Complaint  Patient presents with   Laceration    HPI Rodney Leblanc is a 27 y.o. male.   Patient is a a 27 year old male who presents with chief complaint of laceration to the left wrist.  Patient states he cut the wrist at work with a box cutter about an hour prior to arrival.  Some bleeding initially but that has pretty much stopped.    History reviewed. No pertinent past medical history.  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   Episodic mood disorder (HCC) 05/21/2018   Polysubstance overdose 05/21/2018   Traumatic scrotal hematoma 05/18/2016   Traumatic scrotal hematoma, initial encounter    Exudative retinopathy, 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    Past Surgical History:  Procedure Laterality Date   CYSTOSCOPY  05/17/2016   Procedure: CYSTOSCOPY FLEXIBLE;  Surgeon: Vanna Scotland, MD;  Location: [facility] ORS;  Service: Urology;;   EYE SURGERY     HEMATOMA EVACUATION N/A 05/17/2016   Procedure: EVACUATION HEMATOMA- ;  Surgeon: Vanna Scotland, MD;  Location: [facility] OR[facility];  Service: Urology;  Laterality: N/A;   SCROTAL EXPLORATION N/A 05/17/2016   Procedure: SCROTUM EXPLORATION;  Surgeon: Vanna Scotland, MD;  Location: [facility] OR[facility];  Service: Urology;  Laterality: N/A;       Home Medications    Prior to Admission medications   Medication Sig Start Date End Date Taking? Authorizing Provider  cephALEXin  (KEFLEX) 500 MG capsule Take 1 capsule (500 mg total) by mouth 2 (two) times daily for 10 days. 10/30/22 11/09/22 Yes Candis Schatz, PA-C  ibuprofen (ADVIL) 600 MG tablet Take 1 tablet (600 mg total) by mouth every 6 (six) hours as needed. 06/11/20   Tommi Rumps, PA-C  sulfamethoxazole-trimethoprim (BACTRIM DS) 800-160 MG tablet Take 1 tablet by mouth 2 (two) times daily. 08/06/20   Cuthriell, Delorise Royals, PA-C    Family History History reviewed. No pertinent family history.  Social History Social History   Tobacco Use   Smoking status: Every Day   Smokeless tobacco: Former  Building services engineer status: Every Day  Substance Use Topics   Alcohol use: Yes   Drug use: Yes    Types: Marijuana     Allergies   Patient has no known allergies.   Review of Systems Review of Systems as noted above in HPI.  Other systems reviewed and found to be negative   Physical Exam Triage Vital Signs ED Triage Vitals  Encounter Vitals Group     BP 10/30/22 1207 123/77     Systolic BP Percentile --      Diastolic BP Percentile --      Pulse Rate 10/30/22 1207 60     Resp 10/30/22 1207 16     Temp 10/30/22 1207 98.6 F (37 C)     Temp Source 10/30/22 1207 Oral     SpO2 10/30/22 1207 100 %     Weight 10/30/22 1206 190 lb (86.2  kg)     Height 10/30/22 1206 5\' 6"  (1.676 m)     Head Circumference --      Peak Flow --      Pain Score 10/30/22 1208 0     Pain Loc --      Pain Education --      Exclude from Growth Chart --    No data found.  Updated Vital Signs BP 123/77 (BP Location: Left Arm)   Pulse 60   Temp 98.6 F (37 C) (Oral)   Resp 16   Ht 5\' 6"  (1.676 m)   Wt 190 lb (86.2 kg)   SpO2 100%   BMI 30.67 kg/m   Visual Acuity Right Eye Distance:   Left Eye Distance:   Bilateral Distance:    Right Eye Near:   Left Eye Near:    Bilateral Near:     Physical Exam Constitutional:      Appearance: Normal appearance.  Musculoskeletal:     Left wrist: Laceration  present.       Arms:     Comments: Approximately 1 cm laceration to the ventral side of the left wrist.  New foreign body noted.  Neurological:     Mental Status: He is alert.      UC Treatments / Results  Labs (all labs ordered are listed, but only abnormal results are displayed) Labs Reviewed - No data to display  EKG   Radiology No results found.  Procedures Laceration Repair  Date/Time: 10/30/2022 1:01 PM  Performed by: Candis Schatz, PA-C Authorized by: Candis Schatz, PA-C   Consent:    Consent obtained:  Verbal Anesthesia:    Anesthesia method:  Local infiltration   Local anesthetic:  Lidocaine 1% w/o epi Laceration details:    Location:  Hand   Hand location:  L wrist   Length (cm):  1 Exploration:    Limited defect created (wound extended): no     Imaging outcome: foreign body not noted     Contaminated: no   Treatment:    Area cleansed with:  Chlorhexidine   Amount of cleaning:  Standard   Debridement:  None   Undermining:  None Skin repair:    Repair method:  Tissue adhesive Approximation:    Approximation:  Close Repair type:    Repair type:  Simple Post-procedure details:    Dressing:  Adhesive bandage  (including critical care time)  Medications Ordered in UC Medications - No data to display  Initial Impression / Assessment and Plan / UC Course  I have reviewed the triage vital signs and the nursing notes.  Pertinent labs & imaging results that were available during my care of the patient were reviewed by me and considered in my medical decision making (see chart for details).    Patient with laceration to left wrist with a box cutter at work.  Minimal bleeding wound clean.  Manage as above.  Wound cleaned with chlorhexidine.  Closed with tissue adhesive.  Will give him prescription for Keflex.  Final Clinical Impressions(s) / UC Diagnoses   Final diagnoses:  Laceration of left wrist, initial encounter     Discharge  Instructions      -Keflex: 1 tablet twice a day for 10 days -Keep clean and dry as able.  Would wear bandage over while working until wound heals. -Ibuprofen and Tylenol for pain -Return to clinic for any signs of infection or worsening pain.     ED Prescriptions  Medication Sig Dispense Auth. Provider   cephALEXin (KEFLEX) 500 MG capsule Take 1 capsule (500 mg total) by mouth 2 (two) times daily for 10 days. 20 capsule Candis Schatz, PA-C      PDMP not reviewed this encounter.   Candis Schatz, PA-C 10/30/22 [phone]

## 2022-10-30 NOTE — Discharge Instructions (Signed)
-  Keflex: 1 tablet twice a day for 10 days -Keep clean and dry as able.  Would wear bandage over while working until wound heals. -Ibuprofen and Tylenol for pain -Return to clinic for any signs of infection or worsening pain.

## 2022-12-14 ENCOUNTER — Ambulatory Visit
Admission: EM | Admit: 2022-12-14 | Discharge: 2022-12-14 | Disposition: A | Discharge status: Home / Self Care | Payer: Self-pay | Attending: Emergency Medicine | Admitting: Emergency Medicine

## 2022-12-14 DIAGNOSIS — R519 Headache, unspecified: Secondary | ICD-10-CM

## 2022-12-14 DIAGNOSIS — Z720 Tobacco use: Secondary | ICD-10-CM

## 2022-12-14 DIAGNOSIS — L723 Sebaceous cyst: Secondary | ICD-10-CM

## 2022-12-14 DIAGNOSIS — L089 Local infection of the skin and subcutaneous tissue, unspecified: Secondary | ICD-10-CM

## 2022-12-14 DIAGNOSIS — R220 Localized swelling, mass and lump, head: Secondary | ICD-10-CM

## 2022-12-14 MED ORDER — DOXYCYCLINE HYCLATE 100 MG PO CAPS: 100.0000 mg | ORAL_CAPSULE | Freq: Two times a day (BID) | ORAL | 0 refills | Status: AC

## 2022-12-14 MED ORDER — MUPIROCIN 2 % EX OINT: 1.0000 | TOPICAL_OINTMENT | Freq: Two times a day (BID) | CUTANEOUS | 0 refills | Status: AC

## 2022-12-14 NOTE — Discharge Instructions (Signed)
Take antibiotic as directed. Avoid messing,poking or squeezing the area. Please call dermatologist for evaluation and removal(may take a few months to get in but schedule the appt as it will not go away on its own. ). I have included cyst removal information for review, this is the procedure you need to have completed.

## 2022-12-14 NOTE — ED Triage Notes (Signed)
PT c/o possible cyst on left cheek x10years  Pt states that the cyst has not bothered him until today and it has swollen up and now his face and lower eyelid hurts.

## 2022-12-14 NOTE — ED Provider Notes (Signed)
[facility]    CSN: [id number] Arrival date & time: 12/14/22  0831      History   Chief Complaint Chief Complaint  Patient presents with   Abscess    HPI Rodney Leblanc is a 27 y.o. male.   27 year old male patient, Rodney Leblanc, presents to urgent care for evaluation of left facial swelling.  Patient states he had a cyst on his face has had for approximately 10 years and it is flared up today.  Patient denies messing with the area, request a work note as well, and referral to dermatology.   Patient endorses vaping  The history is provided by the patient. No language interpreter was used.    History reviewed. No pertinent past medical history.  Patient Active Problem List   Diagnosis Date Noted   Infected sebaceous cyst 12/14/2022   Facial pain 12/14/2022   Facial swelling 12/14/2022   Current every day nicotine vaping 12/14/2022   Hepatitis C 10/30/2022   Need for vaccination 10/30/2022   Received influenza vaccination at hospital 10/30/2022   Encounter for routine child health examination without abnormal findings 10/17/2019   Hyperkalemia 10/17/2019   Former smoker 06/23/2019   Attention deficit hyperactivity disorder 05/21/2018   Allergic rhinitis 05/21/2018   Awareness of heartbeats 05/21/2018   Cannot sleep 05/21/2018   Dermatitis, 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   Episodic mood disorder (HCC) 05/21/2018   Polysubstance overdose 05/21/2018   Traumatic scrotal hematoma 05/18/2016   Traumatic scrotal hematoma, initial encounter    Exudative retinopathy, 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    Past Surgical History:  Procedure Laterality Date   CYSTOSCOPY  05/17/2016   Procedure: CYSTOSCOPY FLEXIBLE;  Surgeon: Vanna Scotland, MD;  Location: [facility];  Service: Urology;;   EYE SURGERY     HEMATOMA EVACUATION N/A 05/17/2016   Procedure: EVACUATION HEMATOMA- ;  Surgeon: Vanna Scotland, MD;  Location: [facility];  Service: Urology;  Laterality: N/A;   SCROTAL  EXPLORATION N/A 05/17/2016   Procedure: SCROTUM EXPLORATION;  Surgeon: Vanna Scotland, MD;  Location: [facility];  Service: Urology;  Laterality: N/A;       Home Medications    Prior to Admission medications   Medication Sig Start Date End Date Taking? Authorizing Provider  doxycycline (VIBRAMYCIN) 100 MG capsule Take 1 capsule (100 mg total) by mouth 2 (two) times daily. 12/14/22  Yes Bracy Pepper, Para March, NP  ibuprofen (ADVIL) 600 MG tablet Take 1 tablet (600 mg total) by mouth every 6 (six) hours as needed. 06/11/20  Yes Tommi Rumps, PA-C  mupirocin ointment (BACTROBAN) 2 % Apply 1 Application topically 2 (two) times daily for 7 days. To left cheek(face) 12/14/22 12/21/22 Yes Solana Coggin, Para March, NP    Family History History reviewed. No pertinent family history.  Social History Social History   Tobacco Use   Smoking status: Every Day   Smokeless tobacco: Former  Building services engineer status: Every Day  Substance Use Topics   Alcohol use: Yes   Drug use: Yes    Types: Marijuana     Allergies   Patient has no known allergies.   Review of Systems Review of Systems  Constitutional:  Negative for chills and fever.  HENT:  Negative for ear pain and sore throat.   Eyes:  Negative for pain and visual disturbance.  Respiratory:  Negative for cough and shortness of breath.   Cardiovascular:  Negative for chest pain and palpitations.  Gastrointestinal:  Negative for abdominal pain and vomiting.  Genitourinary:  Negative  for dysuria and hematuria.  Musculoskeletal:  Negative for arthralgias and back pain.  Skin:  Negative for color change and rash.  Neurological:  Negative for seizures and syncope.  All other systems reviewed and are negative.    Physical Exam Triage Vital Signs ED Triage Vitals [12/14/22 0907]  Encounter Vitals Group     BP      Systolic BP Percentile      Diastolic BP Percentile      Pulse      Resp      Temp      Temp src      SpO2      Weight  190 lb (86.2 kg)     Height 5\' 6"  (1.676 m)     Head Circumference      Peak Flow      Pain Score 8     Pain Loc      Pain Education      Exclude from Growth Chart    No data found.  Updated Vital Signs BP (!) 140/91 (BP Location: Left Arm)   Pulse 74   Temp (!) 97.3 F (36.3 C) (Oral)   Ht 5\' 6"  (1.676 m)   Wt 190 lb (86.2 kg)   SpO2 99%   BMI 30.67 kg/m   Visual Acuity Right Eye Distance:   Left Eye Distance:   Bilateral Distance:    Right Eye Near:   Left Eye Near:    Bilateral Near:     Physical Exam Vitals and nursing note reviewed.  Constitutional:      Appearance: Normal appearance. He is well-developed and well-groomed.  HENT:     Head:      Right Ear: Tympanic membrane normal.     Left Ear: Tympanic membrane normal.     Nose: Nose normal.     Mouth/Throat:     Lips: Pink.     Mouth: Mucous membranes are moist.     Pharynx: Oropharynx is clear. Uvula midline.  Cardiovascular:     Rate and Rhythm: Normal rate.  Pulmonary:     Effort: Pulmonary effort is normal.     Comments: Coarse breath sounds throughout Neurological:     Mental Status: He is alert.  Psychiatric:        Behavior: Behavior is cooperative.      UC Treatments / Results  Labs (all labs ordered are listed, but only abnormal results are displayed) Labs Reviewed - No data to display  EKG   Radiology No results found.  Procedures Procedures (including critical care time)  Medications Ordered in UC Medications - No data to display  Initial Impression / Assessment and Plan / UC Course  I have reviewed the triage vital signs and the nursing notes.  Pertinent labs & imaging results that were available during my care of the patient were reviewed by me and considered in my medical decision making (see chart for details).    Discussed exam findings and plan of care with patient, strict go to ER precautions given.   Patient verbalized understanding to this provider.  Ddx:  Infected sebaceous cyst, facial pain, facial swelling, current everyday nicotine vaping. Final Clinical Impressions(s) / UC Diagnoses   Final diagnoses:  Infected sebaceous cyst  Facial pain  Facial swelling  Current every day nicotine vaping     Discharge Instructions      Take antibiotic as directed. Avoid messing,poking or squeezing the area. Please call dermatologist for evaluation and removal(may take  a few months to get in but schedule the appt as it will not go away on its own. ). I have included cyst removal information for review, this is the procedure you need to have completed.     ED Prescriptions     Medication Sig Dispense Auth. Provider   doxycycline (VIBRAMYCIN) 100 MG capsule Take 1 capsule (100 mg total) by mouth 2 (two) times daily. 20 capsule Darrah Dredge, NP   mupirocin ointment (BACTROBAN) 2 % Apply 1 Application topically 2 (two) times daily for 7 days. To left cheek(face) 15 g Eliska Hamil, Para March, NP      PDMP not reviewed this encounter.   Clancy Gourd, NP 12/14/22 [phone]
